# Patient Record
Sex: Male | Born: 2000 | Race: White | Hispanic: No | Marital: Single | Smoking: Never smoker
Health system: Southern US, Community
[De-identification: ages and names within clinical notes are randomized; demographics above are authoritative.]

## PROBLEM LIST (undated history)

## (undated) DIAGNOSIS — J45909 Unspecified asthma, uncomplicated: Secondary | ICD-10-CM

## (undated) HISTORY — PX: NO PAST SURGERIES: SHX2092

---
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
## 2018-01-20 ENCOUNTER — Other Ambulatory Visit: Payer: Self-pay

## 2018-01-20 ENCOUNTER — Emergency Department
Admission: EM | Admit: 2018-01-20 | Discharge: 2018-01-20 | Disposition: A | Discharge status: Home / Self Care | Payer: BLUE CROSS/BLUE SHIELD | Attending: Emergency Medicine | Admitting: Emergency Medicine

## 2018-01-20 ENCOUNTER — Encounter: Payer: Self-pay | Admitting: *Deleted

## 2018-01-20 ENCOUNTER — Emergency Department: Payer: BLUE CROSS/BLUE SHIELD

## 2018-01-20 DIAGNOSIS — Z79899 Other long term (current) drug therapy: Secondary | ICD-10-CM | POA: Insufficient documentation

## 2018-01-20 DIAGNOSIS — J9601 Acute respiratory failure with hypoxia: Secondary | ICD-10-CM

## 2018-01-20 DIAGNOSIS — R0603 Acute respiratory distress: Secondary | ICD-10-CM | POA: Diagnosis present

## 2018-01-20 DIAGNOSIS — J4551 Severe persistent asthma with (acute) exacerbation: Secondary | ICD-10-CM | POA: Insufficient documentation

## 2018-01-20 DIAGNOSIS — J45901 Unspecified asthma with (acute) exacerbation: Secondary | ICD-10-CM

## 2018-01-20 LAB — CBC
HCT: 44.3 % (ref 39.0–52.0)
Hemoglobin: 15.3 g/dL (ref 13.0–17.0)
MCH: 30.3 pg (ref 26.0–34.0)
MCHC: 34.5 g/dL (ref 30.0–36.0)
MCV: 87.7 fL (ref 80.0–100.0)
NRBC: 0 % (ref 0.0–0.2)
PLATELETS: 239 10*3/uL (ref 150–400)
RBC: 5.05 MIL/uL (ref 4.22–5.81)
RDW: 11.8 % (ref 11.5–15.5)
WBC: 9.2 10*3/uL (ref 4.0–10.5)

## 2018-01-20 LAB — COMPREHENSIVE METABOLIC PANEL
ALBUMIN: 5.2 g/dL — AB (ref 3.5–5.0)
ALT: 17 U/L (ref 0–44)
AST: 23 U/L (ref 15–41)
Alkaline Phosphatase: 54 U/L (ref 38–126)
Anion gap: 9 (ref 5–15)
BUN: 10 mg/dL (ref 6–20)
CHLORIDE: 99 mmol/L (ref 98–111)
CO2: 28 mmol/L (ref 22–32)
CREATININE: 0.77 mg/dL (ref 0.61–1.24)
Calcium: 8.9 mg/dL (ref 8.9–10.3)
GFR calc Af Amer: >60 mL/min (ref >60)
GFR calc non Af Amer: >60 mL/min (ref >60)
GLUCOSE: 137 mg/dL — AB (ref 70–99)
Potassium: 3.8 mmol/L (ref 3.5–5.1)
SODIUM: 136 mmol/L (ref 135–145)
Total Bilirubin: 1.1 mg/dL (ref 0.3–1.2)
Total Protein: 7.8 g/dL (ref 6.5–8.1)

## 2018-01-20 MED ORDER — EPINEPHRINE 0.3 MG/0.3ML IJ SOAJ
0.3000 mg | Freq: Once | INTRAMUSCULAR | Status: AC
  Administered 2018-01-20: 0.3 mg via INTRAMUSCULAR

## 2018-01-20 MED ORDER — IPRATROPIUM-ALBUTEROL 0.5-2.5 (3) MG/3ML IN SOLN
3.0000 mL | Freq: Once | RESPIRATORY_TRACT | Status: AC
  Administered 2018-01-20: 3 mL via RESPIRATORY_TRACT

## 2018-01-20 MED ORDER — MAGNESIUM SULFATE 2 GM/50ML IV SOLN
2.0000 g | Freq: Once | INTRAVENOUS | Status: AC
  Administered 2018-01-20: 2 g via INTRAVENOUS

## 2018-01-20 MED ORDER — IPRATROPIUM-ALBUTEROL 0.5-2.5 (3) MG/3ML IN SOLN
RESPIRATORY_TRACT | Status: AC
  Administered 2018-01-20: 3 mL via RESPIRATORY_TRACT
  Filled 2018-01-20: qty 3

## 2018-01-20 MED ORDER — PREDNISONE 50 MG PO TABS: 50.0000 mg | ORAL_TABLET | Freq: Every day | ORAL | 0 refills | Status: DC

## 2018-01-20 NOTE — ED Provider Notes (Signed)
[facility] Emergency Department Provider Note   ____________________________________________    I have reviewed the triage vital signs and the nursing notes.   HISTORY  Chief Complaint Respiratory Distress     HPI Louis Williamson is a 17 y.o. male who presents in respiratory distress.  Patient has a history of asthma.  EMS reports relatively abrupt onset of shortness of breath.  They have given Solu-Medrol, multiple duo nebs.  Patient did not tolerate CPAP.  No further history available at this time due to patient distress   Past Medical History:  Diagnosis Date  . Asthma     There are no active problems to display for this patient.   Past Surgical History:  Procedure Laterality Date  . NO PAST SURGERIES      Prior to Admission medications   Medication Sig Start Date End Date Taking? Authorizing Provider  albuterol (ACCUNEB) 1.25 MG/3ML nebulizer solution Take 3 mLs (1.25 mg total) by nebulization every 4 (four) hours as needed for wheezing or shortness of breath. 12/24/14   Renford Dills, NP  albuterol (PROVENTIL HFA;VENTOLIN HFA) 108 (90 Base) MCG/ACT inhaler Inhale 1-2 puffs into the lungs every 6 (six) hours as needed for wheezing or shortness of breath. 02/21/17   Domenick Gong, MD  fluticasone (FLONASE) 50 MCG/ACT nasal spray Place 2 sprays into both nostrils daily. 02/21/17   Domenick Gong, MD  fluticasone (FLOVENT HFA) 110 MCG/ACT inhaler Inhale into the lungs 2 (two) times daily.    [provider]  ibuprofen (ADVIL,MOTRIN) 600 MG tablet Take 1 tablet (600 mg total) by mouth every 6 (six) hours as needed. 09/20/17   Domenick Gong, MD  Spacer/Aero-Holding Chambers (AEROCHAMBER PLUS) inhaler Use as instructed 02/21/17   Domenick Gong, MD     Allergies Patient has no known allergies.  Family History  Problem Relation Age of Onset  . Healthy Mother   . Healthy Father     Social History Social History     Tobacco Use  . Smoking status: Never Smoker  . Smokeless tobacco: Never Used  Substance Use Topics  . Alcohol use: No  . Drug use: No    Level 5 caveat: Unable to obtain full review of Systems due to severe distress  Constitutional: No fever/chills .  ENT: No throat swelling Cardiovascular: No chest pain Respiratory: As above   ____________________________________________   PHYSICAL EXAM:  VITAL SIGNS: ED Triage Vitals [01/20/18 2016]  Enc Vitals Group     BP 138/86     Pulse Rate (!) 107     Resp      Temp 98.2 F (36.8 C)     Temp Source Axillary     SpO2 100 %     Weight 56.7 kg (125 lb)     Height 1.727 m (5\' 8" )     Head Circumference      Peak Flow      Pain Score 0     Pain Loc      Pain Edu?      Excl. in GC?     Constitutional: Alert and oriented, sitting up in tripod position severe respiratory distress  Head: Atraumatic.  Mouth/Throat: Mucous membranes are moist.   Neck:  Painless ROM Cardiovascular: Tachycardia, regular rhythm. Grossly normal heart sounds.  Good peripheral circulation. Respiratory: Increased respiratory effort with retractions, diffuse wheezing Gastrointestinal: Soft and nontender. No distention.   Musculoskeletal: No lower extremity tenderness nor edema.  Warm and well perfused Neurologic:  No gross focal neurologic deficits are appreciated.  Skin:  Skin is warm, dry and intact. No rash noted.   ____________________________________________   LABS (all labs ordered are listed, but only abnormal results are displayed)  Labs Reviewed  COMPREHENSIVE METABOLIC PANEL - Abnormal; Notable for the following components:      Result Value   Glucose, Bld 137 (*)    Albumin 5.2 (*)    All other components within normal limits  CBC   ____________________________________________  EKG  ED ECG REPORT I, Jene Every, the attending physician, personally viewed and interpreted this ECG.  Date: 01/20/2018  Rhythm: normal  sinus rhythm QRS Axis: normal Intervals: normal ST/T Wave abnormalities: normal Narrative Interpretation: no evidence of acute ischemia  ____________________________________________  RADIOLOGY  Chest x-ray unremarkable ____________________________________________   PROCEDURES  Procedure(s) performed: No  Procedures   Critical Care performed: yes  CRITICAL CARE Performed by: Jene Every   Total critical care time: 30 minutes  Critical care time was exclusive of separately billable procedures and treating other patients.  Critical care was necessary to treat or prevent imminent or life-threatening deterioration.  Critical care was time spent personally by me on the following activities: development of treatment plan with patient and/or surrogate as well as nursing, discussions with consultants, evaluation of patient's response to treatment, examination of patient, obtaining history from patient or surrogate, ordering and performing treatments and interventions, ordering and review of laboratory studies, ordering and review of radiographic studies, pulse oximetry and re-evaluation of patient's condition.  ____________________________________________   INITIAL IMPRESSION / ASSESSMENT AND PLAN / ED COURSE  Pertinent labs & imaging results that were available during my care of the patient were reviewed by me and considered in my medical decision making (see chart for details).  Patient presents in respiratory distress, likely related to severe asthma exacerbation.  Patient given IM epinephrine, 2 g of IV magnesium and started on BiPAP.  DuoNeb's given as well  After treatment and after about 10 minutes on BiPAP patient is markedly improved.  He is able to answer questions now states he is feeling much better.  Mother is here and reports he has had to be hospitalized once or twice before in his life  ----------------------------------------- 10:21 PM on  01/20/2018 -----------------------------------------  Patient is at baseline.  He states he feels much better.  On exam no further wheezing.  Clinically very well-appearing.  He has albuterol at home.  Will give 5 days of prednisone    ____________________________________________   FINAL CLINICAL IMPRESSION(S) / ED DIAGNOSES  Final diagnoses:  Severe asthma with exacerbation, unspecified whether persistent  Acute respiratory failure with hypoxia (HCC)        Note:  This document was prepared using Dragon voice recognition software and may include unintentional dictation errors.    Jene Every, MD 01/20/18 2222

## 2018-01-20 NOTE — ED Notes (Signed)
Bipap off

## 2018-01-20 NOTE — ED Triage Notes (Signed)
Per EMS pt was fine all day and then around 1930 he quickly became short of breath. Pt has know asthma. Had 2 alb nebs, solumedrol and started 2 gms Mag IV en route. Pt states he feels better

## 2018-02-03 ENCOUNTER — Emergency Department: Payer: BLUE CROSS/BLUE SHIELD

## 2018-02-03 ENCOUNTER — Encounter: Payer: Self-pay | Admitting: Emergency Medicine

## 2018-02-03 ENCOUNTER — Other Ambulatory Visit: Payer: Self-pay

## 2018-02-03 ENCOUNTER — Emergency Department
Admission: EM | Admit: 2018-02-03 | Discharge: 2018-02-03 | Disposition: A | Discharge status: Home / Self Care | Payer: BLUE CROSS/BLUE SHIELD | Attending: Emergency Medicine | Admitting: Emergency Medicine

## 2018-02-03 DIAGNOSIS — Z79899 Other long term (current) drug therapy: Secondary | ICD-10-CM | POA: Insufficient documentation

## 2018-02-03 DIAGNOSIS — R0602 Shortness of breath: Secondary | ICD-10-CM | POA: Diagnosis present

## 2018-02-03 DIAGNOSIS — J4541 Moderate persistent asthma with (acute) exacerbation: Secondary | ICD-10-CM | POA: Insufficient documentation

## 2018-02-03 LAB — COMPREHENSIVE METABOLIC PANEL
ALBUMIN: 5.3 g/dL — AB (ref 3.5–5.0)
ALT: 21 U/L (ref 0–44)
AST: 27 U/L (ref 15–41)
Alkaline Phosphatase: 60 U/L (ref 38–126)
Anion gap: 9 (ref 5–15)
BILIRUBIN TOTAL: 1.4 mg/dL — AB (ref 0.3–1.2)
BUN: 11 mg/dL (ref 6–20)
CALCIUM: 9.6 mg/dL (ref 8.9–10.3)
CO2: 29 mmol/L (ref 22–32)
CREATININE: 0.77 mg/dL (ref 0.61–1.24)
Chloride: 102 mmol/L (ref 98–111)
GFR calc Af Amer: >60 mL/min (ref >60)
GFR calc non Af Amer: >60 mL/min (ref >60)
Glucose, Bld: 88 mg/dL (ref 70–99)
Potassium: 4.1 mmol/L (ref 3.5–5.1)
SODIUM: 140 mmol/L (ref 135–145)
TOTAL PROTEIN: 8.1 g/dL (ref 6.5–8.1)

## 2018-02-03 LAB — CBC
HEMATOCRIT: 49.2 % (ref 39.0–52.0)
Hemoglobin: 17.3 g/dL — ABNORMAL HIGH (ref 13.0–17.0)
MCH: 30.7 pg (ref 26.0–34.0)
MCHC: 35.2 g/dL (ref 30.0–36.0)
MCV: 87.4 fL (ref 80.0–100.0)
Platelets: 252 10*3/uL (ref 150–400)
RBC: 5.63 MIL/uL (ref 4.22–5.81)
RDW: 11.8 % (ref 11.5–15.5)
WBC: 10.8 10*3/uL — AB (ref 4.0–10.5)
nRBC: 0 % (ref 0.0–0.2)

## 2018-02-03 MED ORDER — IPRATROPIUM-ALBUTEROL 0.5-2.5 (3) MG/3ML IN SOLN
3.0000 mL | Freq: Once | RESPIRATORY_TRACT | Status: AC
  Administered 2018-02-03: 3 mL via RESPIRATORY_TRACT
  Filled 2018-02-03: qty 3

## 2018-02-03 MED ORDER — ALBUTEROL SULFATE HFA 108 (90 BASE) MCG/ACT IN AERS: 1.0000 | INHALATION_SPRAY | Freq: Four times a day (QID) | RESPIRATORY_TRACT | 0 refills | Status: DC | PRN

## 2018-02-03 MED ORDER — MAGNESIUM SULFATE 2 GM/50ML IV SOLN
2.0000 g | Freq: Once | INTRAVENOUS | Status: AC
  Administered 2018-02-03: 2 g via INTRAVENOUS
  Filled 2018-02-03: qty 50

## 2018-02-03 MED ORDER — PREDNISONE 50 MG PO TABS: 50.0000 mg | ORAL_TABLET | Freq: Every day | ORAL | 0 refills | Status: DC

## 2018-02-03 MED ORDER — METHYLPREDNISOLONE SODIUM SUCC 125 MG IJ SOLR
125.0000 mg | Freq: Once | INTRAMUSCULAR | Status: AC
  Administered 2018-02-03: 125 mg via INTRAVENOUS
  Filled 2018-02-03: qty 2

## 2018-02-03 NOTE — ED Notes (Signed)
Pt arrives in the midst of an asthma attack; sats at stat desk 88-90% on room air; HR 126; pt taken to treatment room 12 via wheelchair

## 2018-02-03 NOTE — ED Triage Notes (Signed)
Pt arrived in the midst of an asthma attack; after taking pt to treatment room and placing him on 2L oxygen via [place], sats up to 97%; pt had audible wheezing upon arrival to ED; pt was seen here 2 weeks ago in a worse situation and didn't want to wait that long; pt says he was just sitting at the table eating dinner when his symptoms began; used his inhaler and nebulizer with no relief; pt unable to speak in complete sentences;

## 2018-02-03 NOTE — ED Provider Notes (Signed)
[facility] Emergency Department Provider Note   ____________________________________________    I have reviewed the triage vital signs and the nursing notes.   HISTORY  Chief Complaint Asthma     HPI Louis Williamson is a 17 y.o. male with a history of asthma presents today with shortness of breath.  Patient reports he was eating dinner when he started developed shortness of breath.  I saw this patient 2 weeks ago during a severe asthma exacerbation, he reports he has been doing well until tonight.  He used 2 albuterol inhalers at home without improvement.  He states it is not as severe today as it was last time he was here.  Denies chest pain.  No recent travel.  No calf pain or swelling.  No fevers or chills.  Past Medical History:  Diagnosis Date  . Asthma     There are no active problems to display for this patient.   Past Surgical History:  Procedure Laterality Date  . NO PAST SURGERIES      Prior to Admission medications   Medication Sig Start Date End Date Taking? Authorizing Provider  albuterol (ACCUNEB) 1.25 MG/3ML nebulizer solution Take 3 mLs (1.25 mg total) by nebulization every 4 (four) hours as needed for wheezing or shortness of breath. 12/24/14  Yes Renford Dills, NP  albuterol (PROVENTIL HFA;VENTOLIN HFA) 108 (90 Base) MCG/ACT inhaler Inhale 1-2 puffs into the lungs every 6 (six) hours as needed for wheezing or shortness of breath. 02/21/17  Yes Domenick Gong, MD  fluticasone (FLONASE) 50 MCG/ACT nasal spray Place 2 sprays into both nostrils daily. 02/21/17  Yes Domenick Gong, MD  fluticasone (FLOVENT HFA) 110 MCG/ACT inhaler Inhale into the lungs 2 (two) times daily.   Yes [provider]  ibuprofen (ADVIL,MOTRIN) 600 MG tablet Take 1 tablet (600 mg total) by mouth every 6 (six) hours as needed. 09/20/17  Yes Domenick Gong, MD  Spacer/Aero-Holding Chambers (AEROCHAMBER PLUS) inhaler Use as instructed  02/21/17  Yes Domenick Gong, MD  predniSONE (DELTASONE) 50 MG tablet Take 1 tablet (50 mg total) by mouth daily with breakfast. 02/03/18   Jene Every, MD     Allergies Patient has no known allergies.  Family History  Problem Relation Age of Onset  . Healthy Mother   . Healthy Father     Social History Social History   Tobacco Use  . Smoking status: Never Smoker  . Smokeless tobacco: Never Used  Substance Use Topics  . Alcohol use: No  . Drug use: No    Review of Systems  Constitutional: No fever/chills Eyes: No visual changes.  ENT: No sore throat. Cardiovascular: Denies chest pain. Respiratory: As above Gastrointestinal: No abdominal pain.  No nausea, no vomiting.   Genitourinary: Negative for dysuria. Musculoskeletal: Negative for back pain. Skin: Negative for rash. Neurological: Negative for headaches or weakness   ____________________________________________   PHYSICAL EXAM:  VITAL SIGNS: ED Triage Vitals  Enc Vitals Group     BP 02/03/18 2010 127/79     Pulse Rate 02/03/18 2010 (!) 107     Resp 02/03/18 2010 19     Temp 02/03/18 2010 97.8 F (36.6 C)     Temp Source 02/03/18 2010 Oral     SpO2 --      Weight 02/03/18 2012 54.4 kg (120 lb)     Height 02/03/18 2012 1.727 m (5\' 8" )     Head Circumference --      Peak Flow --  Pain Score 02/03/18 2012 0     Pain Loc --      Pain Edu? --      Excl. in GC? --     Constitutional: Alert and oriented. No acute distress. Pleasant and interactive  Nose: No congestion/rhinnorhea. Mouth/Throat: Mucous membranes are moist.   Neck:  Painless ROM Cardiovascular: Normal rate, regular rhythm. Grossly normal heart sounds.  Good peripheral circulation. Respiratory: Increased respiratory effort with mild tachypnea no retractions.  Diffuse wheezing Gastrointestinal: Soft and nontender. No distention.  Musculoskeletal: No lower extremity tenderness nor edema.  Warm and well perfused Neurologic:   Normal speech and language. No gross focal neurologic deficits are appreciated.  Skin:  Skin is warm, dry and intact. No rash noted. Psychiatric: Mood and affect are normal. Speech and behavior are normal.  ____________________________________________   LABS (all labs ordered are listed, but only abnormal results are displayed)  Labs Reviewed  CBC - Abnormal; Notable for the following components:      Result Value   WBC 10.8 (*)    Hemoglobin 17.3 (*)    All other components within normal limits  COMPREHENSIVE METABOLIC PANEL - Abnormal; Notable for the following components:   Albumin 5.3 (*)    Total Bilirubin 1.4 (*)    All other components within normal limits   ____________________________________________  EKG  None ____________________________________________  RADIOLOGY  Chest x-ray normal ____________________________________________   PROCEDURES  Procedure(s) performed: No  Procedures   Critical Care performed: yes  CRITICAL CARE Performed by: Jene Everyobert Afsheen Antony   Total critical care time: 30minutes  Critical care time was exclusive of separately billable procedures and treating other patients.  Critical care was necessary to treat or prevent imminent or life-threatening deterioration.  Critical care was time spent personally by me on the following activities: development of treatment plan with patient and/or surrogate as well as nursing, discussions with consultants, evaluation of patient's response to treatment, examination of patient, obtaining history from patient or surrogate, ordering and performing treatments and interventions, ordering and review of laboratory studies, ordering and review of radiographic studies, pulse oximetry and re-evaluation of patient's condition.  ____________________________________________   INITIAL IMPRESSION / ASSESSMENT AND PLAN / ED COURSE  Pertinent labs & imaging results that were available during my care of the  patient were reviewed by me and considered in my medical decision making (see chart for details).  Patient presents with shortness of breath, found to be hypoxic by triage nurse.  Started on oxygen, DuoNeb started.  We will give IV magnesium, IV Solu-Medrol check labs, x-ray and carefully monitor  ----------------------------------------- 10:21 PM on 02/03/2018 -----------------------------------------  Patient is markedly improved after treatment, no further wheezing at this time.  He feels much better.  Vital signs are normal.  Lab work is unremarkable.  Appropriate for outpatient follow-up, he will follow-up with pulmonology    ____________________________________________   FINAL CLINICAL IMPRESSION(S) / ED DIAGNOSES  Final diagnoses:  Moderate persistent asthma with exacerbation        Note:  This document was prepared using Dragon voice recognition software and may include unintentional dictation errors.    Jene EveryKinner, Mavi Un, MD 02/03/18 2221

## 2018-02-03 NOTE — ED Notes (Signed)
Patient states he feels better, respirations are not labored at this time. Patient is currently on room air with a pulse ox of 94%. Patient able to speak in complete sentences without pause. Inspiratory wheezing still present, otherwise good air exchange noted.

## 2018-02-03 NOTE — ED Notes (Signed)
Patient remains on room air with sats at 97%.

## 2019-07-16 IMAGING — CR DG HAND COMPLETE 3+V*R*
3 series · 3 of 3 positions shown · non-contrast
Comparison: None.

CLINICAL DATA: Pain after hitting wall

EXAM:
RIGHT HAND - COMPLETE 3+ VIEW

[hand ap]
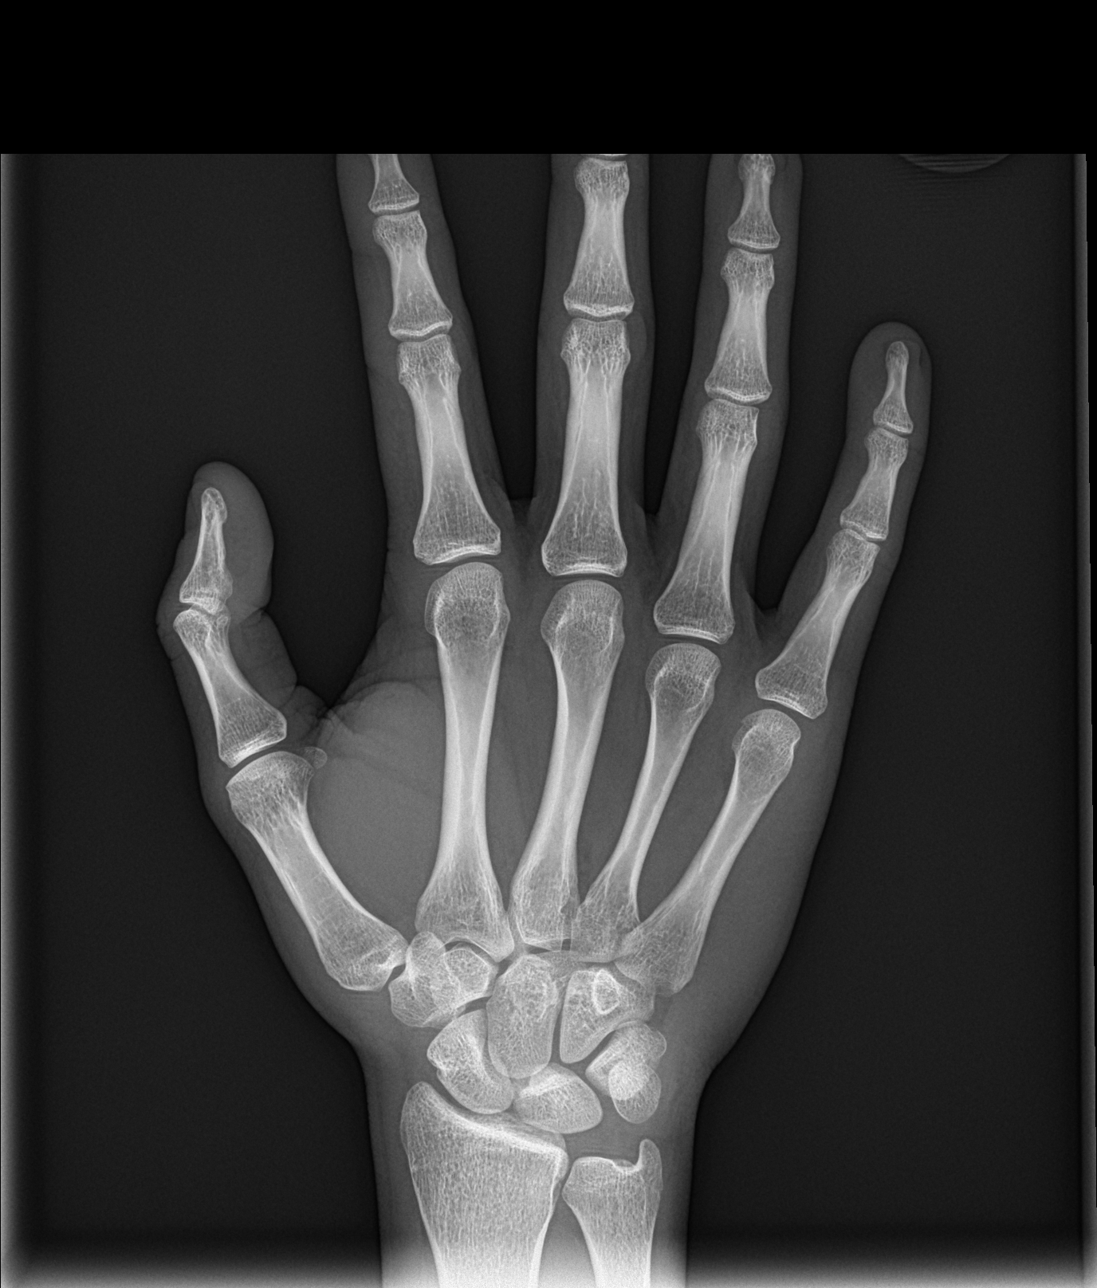

[hand obl]
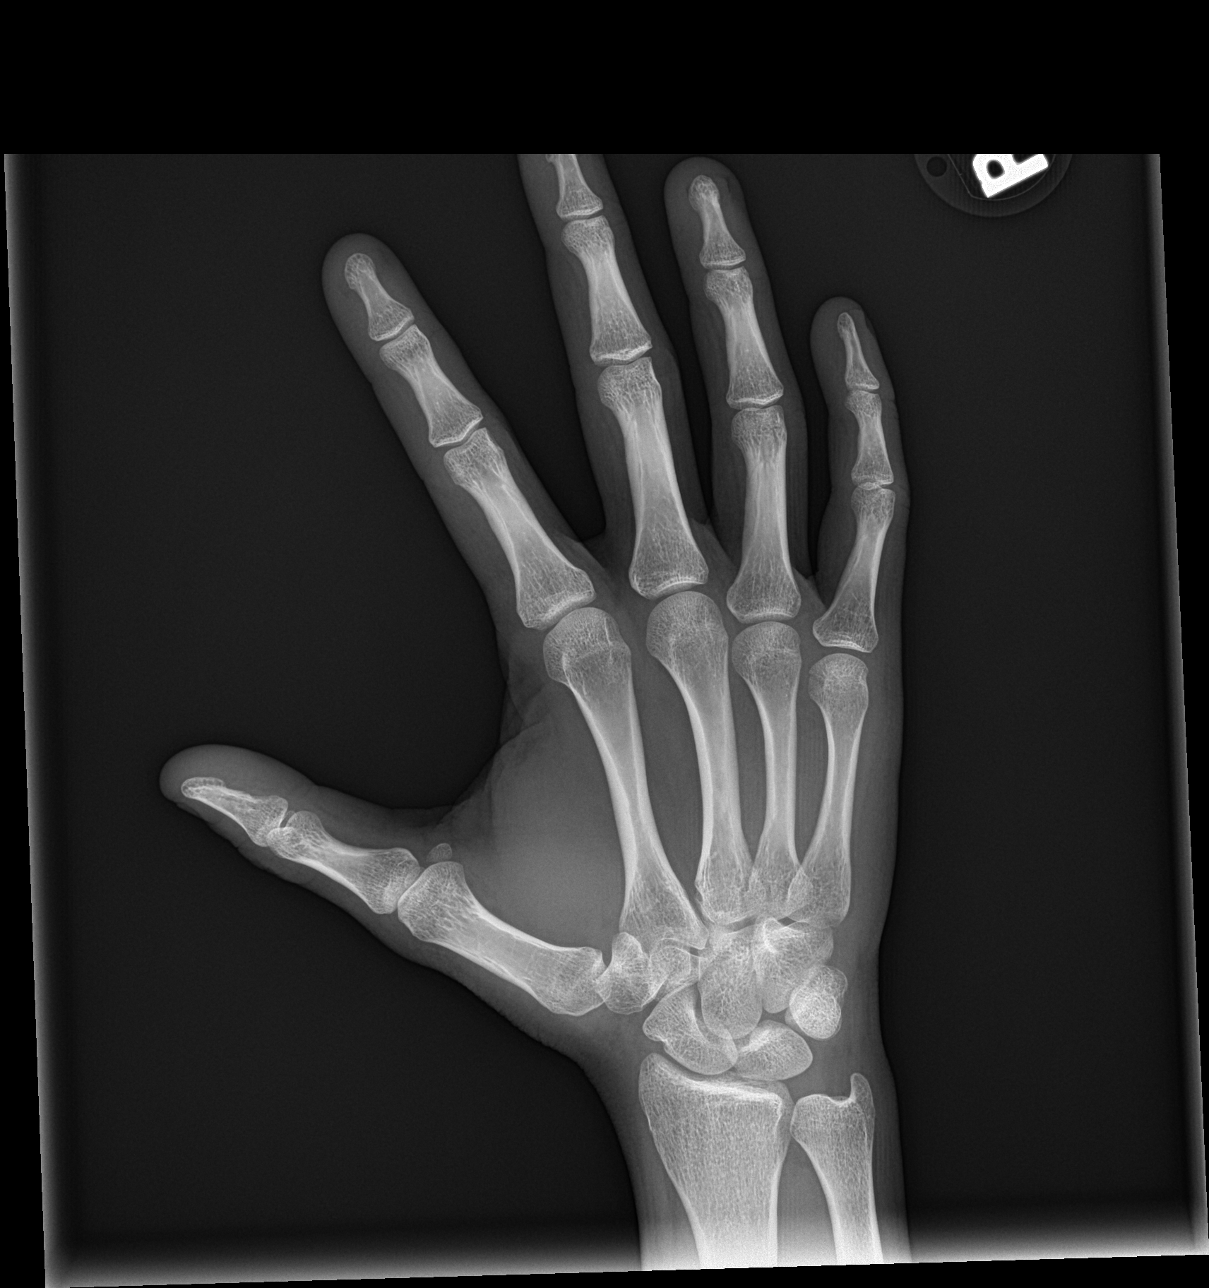

[hand lat]
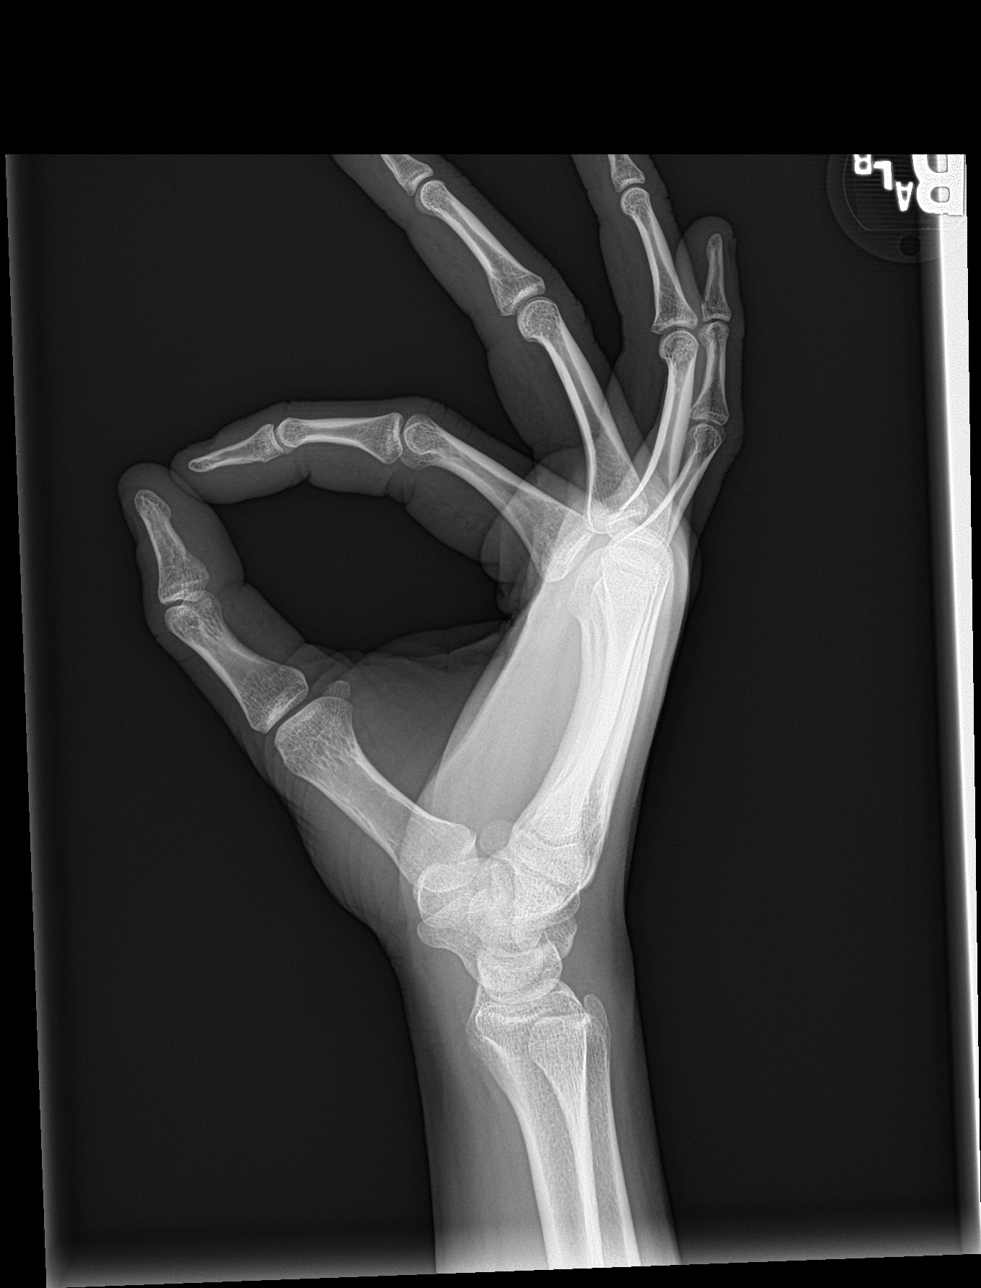

[3 of 3 positions shown; findings below may reference images not displayed]

FINDINGS: Frontal, oblique, and lateral views obtained. There is mild soft
tissue swelling. There is a fracture of the distal aspect of the
fifth metacarpal with alignment essentially anatomic. No other
fracture. No dislocation. No appreciable joint space narrowing or
erosion.
IMPRESSION: Nondisplaced fracture distal aspect fifth metacarpal with soft
tissue swelling in this area. No other fracture. No dislocation. No
evident arthropathy.

These results will be called to the ordering clinician or
representative by the Radiologist Assistant, and communication
documented in the PACS or zVision Dashboard.

## 2020-11-09 ENCOUNTER — Other Ambulatory Visit: Payer: Self-pay

## 2020-11-09 ENCOUNTER — Inpatient Hospital Stay
Admission: EM | Admit: 2020-11-09 | Discharge: 2020-11-11 | DRG: 202 | Disposition: A | Discharge status: Home / Self Care | Payer: BC Managed Care – PPO | Attending: Internal Medicine | Admitting: Internal Medicine

## 2020-11-09 ENCOUNTER — Emergency Department: Payer: BC Managed Care – PPO

## 2020-11-09 DIAGNOSIS — R0902 Hypoxemia: Secondary | ICD-10-CM

## 2020-11-09 DIAGNOSIS — R0603 Acute respiratory distress: Secondary | ICD-10-CM

## 2020-11-09 DIAGNOSIS — I959 Hypotension, unspecified: Secondary | ICD-10-CM | POA: Diagnosis present

## 2020-11-09 DIAGNOSIS — J4551 Severe persistent asthma with (acute) exacerbation: Principal | ICD-10-CM | POA: Diagnosis present

## 2020-11-09 DIAGNOSIS — Z20822 Contact with and (suspected) exposure to covid-19: Secondary | ICD-10-CM | POA: Diagnosis present

## 2020-11-09 DIAGNOSIS — E876 Hypokalemia: Secondary | ICD-10-CM | POA: Diagnosis not present

## 2020-11-09 DIAGNOSIS — E861 Hypovolemia: Secondary | ICD-10-CM | POA: Diagnosis present

## 2020-11-09 DIAGNOSIS — J9601 Acute respiratory failure with hypoxia: Secondary | ICD-10-CM | POA: Diagnosis present

## 2020-11-09 DIAGNOSIS — R000 Tachycardia, unspecified: Secondary | ICD-10-CM | POA: Diagnosis present

## 2020-11-09 DIAGNOSIS — Z7951 Long term (current) use of inhaled steroids: Secondary | ICD-10-CM

## 2020-11-09 DIAGNOSIS — J45901 Unspecified asthma with (acute) exacerbation: Secondary | ICD-10-CM | POA: Diagnosis present

## 2020-11-09 MED ORDER — IPRATROPIUM-ALBUTEROL 0.5-2.5 (3) MG/3ML IN SOLN
3.0000 mL | Freq: Once | RESPIRATORY_TRACT | Status: AC
  Administered 2020-11-10: 3 mL via RESPIRATORY_TRACT
  Filled 2020-11-09: qty 3

## 2020-11-09 MED ORDER — IPRATROPIUM-ALBUTEROL 0.5-2.5 (3) MG/3ML IN SOLN
3.0000 mL | Freq: Once | RESPIRATORY_TRACT | Status: AC
  Administered 2020-11-10: 3 mL via RESPIRATORY_TRACT

## 2020-11-09 NOTE — ED Provider Notes (Signed)
[facility] Emergency Department Provider Note   ____________________________________________   Event Date/Time   First MD Initiated Contact with Patient 11/09/20 2348     (approximate)  I have reviewed the triage vital signs and the nursing notes.   HISTORY  Chief Complaint Shortness of Breath  Level of V caveat: Limited by respiratory distress  HPI Louis Williamson is a 20 y.o. male brought to the ED via EMS from home with a chief complaint of respiratory distress.  Patient has a history of asthma, never BiPAP or intubation, who states he worked outdoors all day today.  Began having an asthma exacerbation this evening.  Room air saturations in the mid 70%'s.  Received 6 albuterol nebulizers, 1 DuoNeb, 125 mg IV Solu-Medrol, 2 g IV magnesium, 0.5 mg IM epinephrine prior to arrival.  Arrives to the ED on CPAP.  Denies fever, cough, abdominal pain, nausea, vomiting or diarrhea.     Past Medical History:  Diagnosis Date   Asthma     Patient Active Problem List   Diagnosis Date Noted   Acute asthma exacerbation 11/10/2020     Past Surgical History:  Procedure Laterality Date   NO PAST SURGERIES      Prior to Admission medications   Medication Sig Start Date End Date Taking? Authorizing Provider  albuterol (PROVENTIL HFA;VENTOLIN HFA) 108 (90 Base) MCG/ACT inhaler Inhale 1-2 puffs into the lungs every 6 (six) hours as needed for wheezing or shortness of breath. 02/03/18  Yes Jene Every, MD  SYMBICORT 160-4.5 MCG/ACT inhaler Inhale 2 puffs into the lungs in the morning and at bedtime. 07/13/20  Yes [provider]  albuterol (ACCUNEB) 1.25 MG/3ML nebulizer solution Take 3 mLs (1.25 mg total) by nebulization every 4 (four) hours as needed for wheezing or shortness of breath. 12/24/14   Renford Dills, NP    Allergies Patient has no known allergies.  Family History  Problem Relation Age of Onset   Healthy Mother    Healthy Father      Social History Social History   Tobacco Use   Smoking status: Never   Smokeless tobacco: Never  Vaping Use   Vaping Use: Never used  Substance Use Topics   Alcohol use: No   Drug use: No    Review of Systems  Constitutional: No fever/chills Eyes: No visual changes. ENT: No sore throat. Cardiovascular: Denies chest pain. Respiratory: Positive for shortness of breath. Gastrointestinal: No abdominal pain.  No nausea, no vomiting.  No diarrhea.  No constipation. Genitourinary: Negative for dysuria. Musculoskeletal: Negative for back pain. Skin: Negative for rash. Neurological: Negative for headaches, focal weakness or numbness.   ____________________________________________   PHYSICAL EXAM:  VITAL SIGNS: ED Triage Vitals  Enc Vitals Group     BP      Pulse      Resp      Temp      Temp src      SpO2      Weight      Height      Head Circumference      Peak Flow      Pain Score      Pain Loc      Pain Edu?      Excl. in GC?     Constitutional: Alert and oriented.  Ill appearing and in moderate acute distress. Eyes: Conjunctivae are normal. PERRL. EOMI. Head: Atraumatic. Nose: No congestion/rhinnorhea. Mouth/Throat: Mucous membranes are moist.   Neck: No stridor.  Cardiovascular: Tachycardic rate, regular rhythm. Grossly normal heart sounds.  Good peripheral circulation. Respiratory: Increased respiratory effort.  Retractions.  Tripoding.  Lungs with diminished aeration bilaterally. Gastrointestinal: Soft and nontender to light or deep palpation. No distention. No abdominal bruits. No CVA tenderness. Musculoskeletal: No lower extremity tenderness nor edema.  No joint effusions. Neurologic:  Normal speech and language. No gross focal neurologic deficits are appreciated.  Skin:  Skin is warm, dry and intact. No rash noted. Psychiatric: Mood and affect are normal. Speech and behavior are normal.  ____________________________________________    LABS (all labs ordered are listed, but only abnormal results are displayed)  Labs Reviewed  CBC WITH DIFFERENTIAL/PLATELET - Abnormal; Notable for the following components:      Result Value   MCHC 37.4 (*)    All other components within normal limits  COMPREHENSIVE METABOLIC PANEL - Abnormal; Notable for the following components:   Potassium 2.7 (*)    CO2 20 (*)    Glucose, Bld 168 (*)    Calcium 8.8 (*)    All other components within normal limits  BLOOD GAS, ARTERIAL - Abnormal; Notable for the following components:   pH, Arterial 7.61 (*)    pCO2 arterial 19 (*)    pO2, Arterial 165 (*)    Bicarbonate 19.1 (*)    All other components within normal limits  RESP PANEL BY RT-PCR (FLU A&B, COVID) ARPGX2  CK  HIV ANTIBODY (ROUTINE TESTING W REFLEX)  MAGNESIUM  TROPONIN I (HIGH SENSITIVITY)  TROPONIN I (HIGH SENSITIVITY)   ____________________________________________  EKG  ED ECG REPORT I, Terissa Haffey J, the attending physician, personally viewed and interpreted this ECG.   Date: 11/09/2020  EKG Time: 2355  Rate: 111  Rhythm: sinus tachycardia  Axis: Normal  Intervals:none  ST&T Change: Nonspecific  ____________________________________________  RADIOLOGY I, Alyza Artiaga J, personally viewed and evaluated these images (plain radiographs) as part of my medical decision making, as well as reviewing the written report by the radiologist.  ED MD interpretation: No acute cardiopulmonary process  Official radiology report(s): DG Chest Port 1 View  Result Date: 11/10/2020 CLINICAL DATA:  Worsening shortness of breath. EXAM: PORTABLE CHEST 1 VIEW COMPARISON:  February 03, 2018 FINDINGS: The heart size and mediastinal contours are within normal limits. Both lungs are clear. The visualized skeletal structures are unremarkable. IMPRESSION: No active cardiopulmonary disease. Electronically Signed   By: Aram Candela M.D.   On: 11/10/2020 00:05     ____________________________________________   PROCEDURES  Procedure(s) performed (including Critical Care):  .1-3 Lead EKG Interpretation  Date/Time: 11/09/2020 11:54 PM Performed by: Irean Hong, MD Authorized by: Irean Hong, MD    CRITICAL CARE Performed by: Irean Hong   Total critical care time: 45 minutes  Critical care time was exclusive of separately billable procedures and treating other patients.  Critical care was necessary to treat or prevent imminent or life-threatening deterioration.  Critical care was time spent personally by me on the following activities: development of treatment plan with patient and/or surrogate as well as nursing, discussions with consultants, evaluation of patient's response to treatment, examination of patient, obtaining history from patient or surrogate, ordering and performing treatments and interventions, ordering and review of laboratory studies, ordering and review of radiographic studies, pulse oximetry and re-evaluation of patient's condition.  ____________________________________________   INITIAL IMPRESSION / ASSESSMENT AND PLAN / ED COURSE  As part of my medical decision making, I reviewed the following data within the electronic MEDICAL RECORD NUMBER Nursing notes  reviewed and incorporated, Labs reviewed, EKG interpreted, Old chart reviewed, Radiograph reviewed, Discussed with admitting physician, and Notes from prior ED visits     20 year old male presenting with asthma exacerbation and hypoxia. Differential includes, but is not limited to, viral syndrome, bronchitis including COPD exacerbation, pneumonia, reactive airway disease including asthma, CHF including exacerbation with or without pulmonary/interstitial edema, pneumothorax, ACS, thoracic trauma, and pulmonary embolism.   Will obtain cardiac panel, chest x-ray.  Switch to BiPAP upon patient's arrival to the treatment room.  Will administer stacked DuoNebs.  Anticipate  hospitalization.  Clinical Course as of 11/10/20 0234  Tue Nov 10, 2020  0019 Breathing improved with low-dose IV Ativan.  Patient looks more comfortable now on BiPAP.  Chest x-ray clear.  Awaiting lab results. [JS]  0119 Hypokalemia noted; will replete IV.  Will discuss with hospitalist services for admission. [JS]  0230 Able to wean patient off BiPAP and patient is currently sleeping on nasal cannula. [JS]    Clinical Course User Index [JS] Irean Hong, MD     ____________________________________________   FINAL CLINICAL IMPRESSION(S) / ED DIAGNOSES  Final diagnoses:  Severe persistent asthma with exacerbation  Hypoxia  Respiratory distress  Hypokalemia     ED Discharge Orders     None        Note:  This document was prepared using Dragon voice recognition software and may include unintentional dictation errors.    Irean Hong, MD 11/10/20 [phone]

## 2020-11-09 NOTE — ED Triage Notes (Signed)
Pt BIBA for resp distress. Pt with hx of asthma, working in yard today when he became increasingly SOB. Pt used his albuterol resue inhaler "tons of times" with no relief. Upon EMS arrival pt with increased WOB at 79% on RA. Pt given 6 albuterol SVN, 1Duoneb, 125 solumedrol, 2g mag, 0.5mg  epi IM.    En route ETCO2 60's  Dexi 130

## 2020-11-10 ENCOUNTER — Encounter: Payer: Self-pay | Admitting: Internal Medicine

## 2020-11-10 DIAGNOSIS — R000 Tachycardia, unspecified: Secondary | ICD-10-CM | POA: Diagnosis present

## 2020-11-10 DIAGNOSIS — I9589 Other hypotension: Secondary | ICD-10-CM | POA: Diagnosis not present

## 2020-11-10 DIAGNOSIS — E876 Hypokalemia: Secondary | ICD-10-CM

## 2020-11-10 DIAGNOSIS — Z20822 Contact with and (suspected) exposure to covid-19: Secondary | ICD-10-CM | POA: Diagnosis present

## 2020-11-10 DIAGNOSIS — E861 Hypovolemia: Secondary | ICD-10-CM

## 2020-11-10 DIAGNOSIS — J9601 Acute respiratory failure with hypoxia: Secondary | ICD-10-CM

## 2020-11-10 DIAGNOSIS — Z7951 Long term (current) use of inhaled steroids: Secondary | ICD-10-CM | POA: Diagnosis not present

## 2020-11-10 DIAGNOSIS — J4551 Severe persistent asthma with (acute) exacerbation: Secondary | ICD-10-CM | POA: Diagnosis present

## 2020-11-10 DIAGNOSIS — I959 Hypotension, unspecified: Secondary | ICD-10-CM | POA: Diagnosis present

## 2020-11-10 DIAGNOSIS — J45901 Unspecified asthma with (acute) exacerbation: Secondary | ICD-10-CM | POA: Diagnosis present

## 2020-11-10 DIAGNOSIS — J4521 Mild intermittent asthma with (acute) exacerbation: Secondary | ICD-10-CM | POA: Diagnosis not present

## 2020-11-10 LAB — COMPREHENSIVE METABOLIC PANEL
ALT: 14 U/L (ref 0–44)
AST: 23 U/L (ref 15–41)
Albumin: 4.6 g/dL (ref 3.5–5.0)
Alkaline Phosphatase: 58 U/L (ref 38–126)
Anion gap: 14 (ref 5–15)
BUN: 14 mg/dL (ref 6–20)
CO2: 20 mmol/L — ABNORMAL LOW (ref 22–32)
Calcium: 8.8 mg/dL — ABNORMAL LOW (ref 8.9–10.3)
Chloride: 104 mmol/L (ref 98–111)
Creatinine, Ser: 0.94 mg/dL (ref 0.61–1.24)
GFR, Estimated: >60 mL/min (ref >60)
Glucose, Bld: 168 mg/dL — ABNORMAL HIGH (ref 70–99)
Potassium: 2.7 mmol/L — CL (ref 3.5–5.1)
Sodium: 138 mmol/L (ref 135–145)
Total Bilirubin: 0.9 mg/dL (ref 0.3–1.2)
Total Protein: 7.4 g/dL (ref 6.5–8.1)

## 2020-11-10 LAB — CBC WITH DIFFERENTIAL/PLATELET
Abs Immature Granulocytes: 0.02 10*3/uL (ref 0.00–0.07)
Basophils Absolute: 0.1 10*3/uL (ref 0.0–0.1)
Basophils Relative: 1 %
Eosinophils Absolute: 0.3 10*3/uL (ref 0.0–0.5)
Eosinophils Relative: 4 %
HCT: 41.4 % (ref 39.0–52.0)
Hemoglobin: 15.5 g/dL (ref 13.0–17.0)
Immature Granulocytes: 0 %
Lymphocytes Relative: 34 %
Lymphs Abs: 3.1 10*3/uL (ref 0.7–4.0)
MCH: 32.3 pg (ref 26.0–34.0)
MCHC: 37.4 g/dL — ABNORMAL HIGH (ref 30.0–36.0)
MCV: 86.3 fL (ref 80.0–100.0)
Monocytes Absolute: 0.5 10*3/uL (ref 0.1–1.0)
Monocytes Relative: 6 %
Neutro Abs: 5.1 10*3/uL (ref 1.7–7.7)
Neutrophils Relative %: 55 %
Platelets: 258 10*3/uL (ref 150–400)
RBC: 4.8 MIL/uL (ref 4.22–5.81)
RDW: 11.8 % (ref 11.5–15.5)
WBC: 9.1 10*3/uL (ref 4.0–10.5)
nRBC: 0 % (ref 0.0–0.2)

## 2020-11-10 LAB — CK: Total CK: 81 U/L (ref 49–397)

## 2020-11-10 LAB — BLOOD GAS, ARTERIAL
Acid-Base Excess: 0.5 mmol/L (ref 0.0–2.0)
Bicarbonate: 19.1 mmol/L — ABNORMAL LOW (ref 20.0–28.0)
Delivery systems: POSITIVE
Expiratory PAP: 5
FIO2: 0.28
Inspiratory PAP: 10
O2 Saturation: 99.7 %
Patient temperature: 37
pCO2 arterial: 19 mmHg — CL (ref 32.0–48.0)
pH, Arterial: 7.61 (ref 7.350–7.450)
pO2, Arterial: 165 mmHg — ABNORMAL HIGH (ref 83.0–108.0)

## 2020-11-10 LAB — POTASSIUM: Potassium: 4.4 mmol/L (ref 3.5–5.1)

## 2020-11-10 LAB — RESP PANEL BY RT-PCR (FLU A&B, COVID) ARPGX2
Influenza A by PCR: NEGATIVE
Influenza B by PCR: NEGATIVE
SARS Coronavirus 2 by RT PCR: NEGATIVE

## 2020-11-10 LAB — TROPONIN I (HIGH SENSITIVITY)
Troponin I (High Sensitivity): 7 ng/L (ref <18)
Troponin I (High Sensitivity): 9 ng/L (ref <18)

## 2020-11-10 LAB — MAGNESIUM: Magnesium: 3.4 mg/dL — ABNORMAL HIGH (ref 1.7–2.4)

## 2020-11-10 MED ORDER — ENOXAPARIN SODIUM 40 MG/0.4ML IJ SOSY
40.0000 mg | PREFILLED_SYRINGE | INTRAMUSCULAR | Status: DC
  Administered 2020-11-10: 40 mg via SUBCUTANEOUS
  Filled 2020-11-10: qty 0.4

## 2020-11-10 MED ORDER — POTASSIUM CHLORIDE 10 MEQ/100ML IV SOLN
10.0000 meq | Freq: Once | INTRAVENOUS | Status: AC
  Administered 2020-11-10: 10 meq via INTRAVENOUS

## 2020-11-10 MED ORDER — POTASSIUM CHLORIDE 10 MEQ/100ML IV SOLN
INTRAVENOUS | Status: AC
  Administered 2020-11-10: 10 meq via INTRAVENOUS
  Filled 2020-11-10: qty 100

## 2020-11-10 MED ORDER — METHYLPREDNISOLONE SODIUM SUCC 125 MG IJ SOLR
120.0000 mg | INTRAMUSCULAR | Status: AC
Start: 2020-11-10 — End: 2020-11-10
  Administered 2020-11-10: 120 mg via INTRAVENOUS
  Filled 2020-11-10: qty 2

## 2020-11-10 MED ORDER — POTASSIUM CHLORIDE 10 MEQ/100ML IV SOLN
10.0000 meq | Freq: Once | INTRAVENOUS | Status: AC
  Administered 2020-11-10: 10 meq via INTRAVENOUS
  Filled 2020-11-10: qty 100

## 2020-11-10 MED ORDER — POTASSIUM CHLORIDE 10 MEQ/100ML IV SOLN
10.0000 meq | Freq: Once | INTRAVENOUS | Status: AC
  Filled 2020-11-10: qty 100

## 2020-11-10 MED ORDER — MIDODRINE HCL 5 MG PO TABS
10.0000 mg | ORAL_TABLET | Freq: Three times a day (TID) | ORAL | Status: DC
  Administered 2020-11-10 (×2): 10 mg via ORAL
  Filled 2020-11-10 (×4): qty 2

## 2020-11-10 MED ORDER — IPRATROPIUM-ALBUTEROL 0.5-2.5 (3) MG/3ML IN SOLN
RESPIRATORY_TRACT | Status: AC
  Administered 2020-11-10: 3 mL via RESPIRATORY_TRACT
  Filled 2020-11-10: qty 3

## 2020-11-10 MED ORDER — SODIUM CHLORIDE 0.9 % IV BOLUS
500.0000 mL | Freq: Once | INTRAVENOUS | Status: AC
  Administered 2020-11-10: 500 mL via INTRAVENOUS

## 2020-11-10 MED ORDER — NICOTINE 21 MG/24HR TD PT24
21.0000 mg | MEDICATED_PATCH | Freq: Once | TRANSDERMAL | Status: AC
  Administered 2020-11-10: 21 mg via TRANSDERMAL
  Filled 2020-11-10: qty 1

## 2020-11-10 MED ORDER — PREDNISONE 20 MG PO TABS: 40.0000 mg | ORAL_TABLET | Freq: Every day | ORAL | Status: DC

## 2020-11-10 MED ORDER — SODIUM CHLORIDE 0.9 % IV BOLUS
1000.0000 mL | Freq: Once | INTRAVENOUS | Status: AC
  Administered 2020-11-10: 1000 mL via INTRAVENOUS

## 2020-11-10 MED ORDER — IPRATROPIUM-ALBUTEROL 0.5-2.5 (3) MG/3ML IN SOLN
3.0000 mL | Freq: Four times a day (QID) | RESPIRATORY_TRACT | Status: DC
  Administered 2020-11-10 – 2020-11-11 (×4): 3 mL via RESPIRATORY_TRACT
  Filled 2020-11-10 (×4): qty 3

## 2020-11-10 MED ORDER — LORAZEPAM 2 MG/ML IJ SOLN
0.5000 mg | Freq: Once | INTRAMUSCULAR | Status: AC
  Administered 2020-11-10: 0.5 mg via INTRAVENOUS
  Filled 2020-11-10: qty 1

## 2020-11-10 NOTE — ED Notes (Signed)
Pt NAD fowlers in bed, breathing even and unlabored. A/ox4, speaking in full and complete sentences, pt states he laid down last night to go to bed and had an asthma exacerbation. Pt states this has happened before many times and this has been standard TX. LS clear. VSS

## 2020-11-10 NOTE — ED Notes (Signed)
MD notfied of K of 2.7

## 2020-11-10 NOTE — ED Notes (Signed)
MD at bedside. 

## 2020-11-10 NOTE — H&P (Addendum)
[place]   PATIENT NAME: Louis Williamson    MR#:  [id number]  DATE OF BIRTH:  April 21, 2000  DATE OF ADMISSION:  11/09/2020  PRIMARY CARE PHYSICIAN: [facility]   Patient is coming from: Home  REQUESTING/REFERRING PHYSICIAN: Dionne Bucy, MD  CHIEF COMPLAINT:   Chief Complaint  Patient presents with  . Shortness of Breath    HISTORY OF PRESENT ILLNESS:  Louis Williamson is a 20 y.o. Caucasian male with medical history significant for asthma, who presented to the emergency room with acute onset of worsening dry cough and wheezing with associated dyspnea for the last couple of days.  He denied any fever or chills.  No chest pain or palpitations.  No nausea or vomiting or abdominal pain.  Did not any rhinorrhea or nasal congestion or sore throat or earache.  No dysuria, oliguria or hematuria or flank pain.  ED Course: Upon position to the emergency room heart rate was 109 and pulse symmetry is 100% on CPAP and later on he was placed on BiPAP then tapered off.  Blood pressure later on dropped to 85/31 and responded to IV fluids coming up to 124/56.  EKG as reviewed by me :EKG showed sinus tachycardia with rate 111 with T wave inversion anteroseptally   Labs revealed an ABG with pH 7.61, HCO3 of 19.1 PCO2 of 19 and PO2 of 165 on BiPAP.  CBC showed no significant normalities.  CMP revealed hypokalemia of 2.7 and blood glucose was 168.  CK was 81 and high-sensitivity troponin I was 7 and later 9.  Influenza antigens and COVID-19 PCR came back negative.    Imaging: Portable chest ray showed no acute Pulmonary disease  The patient was given 6 nebulized Abrol and 1 DuoNeb as well as IV magnesium sulfate and IV Solu-Medrol by EMS.  In the ER he received 2 more duo nebs as well as 40 mill equivalent of IV potassium chloride and later was ordered 1 L bolus of IV normal saline.  He will be admitted to a progressive unit bed for further evaluation and management. PAST  MEDICAL HISTORY:   Past Medical History:  Diagnosis Date  . Asthma     PAST SURGICAL HISTORY:   Past Surgical History:  Procedure Laterality Date  . NO PAST SURGERIES      SOCIAL HISTORY:   Social History   Tobacco Use  . Smoking status: Never  . Smokeless tobacco: Never  Substance Use Topics  . Alcohol use: No    FAMILY HISTORY:   Family History  Problem Relation Age of Onset  . Healthy Mother   . Healthy Father     DRUG ALLERGIES:  No Known Allergies  REVIEW OF SYSTEMS:   ROS As per history of present illness. All pertinent systems were reviewed above. Constitutional, HEENT, cardiovascular, respiratory, GI, GU, musculoskeletal, neuro, psychiatric, endocrine, integumentary and hematologic systems were reviewed and are otherwise negative/unremarkable except for positive findings mentioned above in the HPI.   MEDICATIONS AT HOME:   Prior to Admission medications   Medication Sig Start Date End Date Taking? Authorizing Provider  albuterol (PROVENTIL HFA;VENTOLIN HFA) 108 (90 Base) MCG/ACT inhaler Inhale 1-2 puffs into the lungs every 6 (six) hours as needed for wheezing or shortness of breath. 02/03/18  Yes Jene Every, MD  SYMBICORT 160-4.5 MCG/ACT inhaler Inhale 2 puffs into the lungs in the morning and at bedtime. 07/13/20  Yes [provider]  albuterol (ACCUNEB) 1.25 MG/3ML nebulizer solution  Take 3 mLs (1.25 mg total) by nebulization every 4 (four) hours as needed for wheezing or shortness of breath. 12/24/14   Renford Dills, NP      VITAL SIGNS:  Blood pressure (!) 90/34, pulse (!) 105, resp. rate 20, height 5\' 7"  (1.702 m), weight 61.2 kg, SpO2 94 %.  PHYSICAL EXAMINATION:  Physical Exam  GENERAL:  20 y.o.-year-old patient lying in the bed with mild respiratory distress with conversational dyspnea.   EYES: Pupils equal, round, reactive to light and accommodation. No scleral icterus. Extraocular muscles intact.  HEENT: Head atraumatic,  normocephalic. Oropharynx and nasopharynx clear.  NECK:  Supple, no jugular venous distention. No thyroid enlargement, no tenderness.  LUNGS: Diffuse expiratory wheezes with slightly diminished expiratory airflow with harsh vesicular breathing. CARDIOVASCULAR: Regular rate and rhythm, S1, S2 normal. No murmurs, rubs, or gallops.  ABDOMEN: Soft, nondistended, nontender. Bowel sounds present. No organomegaly or mass.  EXTREMITIES: No pedal edema, cyanosis, or clubbing.  NEUROLOGIC: Cranial nerves II through XII are intact. Muscle strength 5/5 in all extremities. Sensation intact. Gait not checked.  PSYCHIATRIC: The patient is alert and oriented x 3.  Normal affect and good eye contact. SKIN: No obvious rash, lesion, or ulcer.   LABORATORY PANEL:   CBC Recent Labs  Lab 11/10/20 0015  WBC 9.1  HGB 15.5  HCT 41.4  PLT 258   ------------------------------------------------------------------------------------------------------------------  Chemistries  Recent Labs  Lab 11/10/20 0015  NA 138  K 2.7*  CL 104  CO2 20*  GLUCOSE 168*  BUN 14  CREATININE 0.94  CALCIUM 8.8*  AST 23  ALT 14  ALKPHOS 58  BILITOT 0.9   ------------------------------------------------------------------------------------------------------------------  Cardiac Enzymes No results for input(s): TROPONINI in the last 168 hours. ------------------------------------------------------------------------------------------------------------------  RADIOLOGY:  DG Chest Port 1 View  Result Date: 11/10/2020 CLINICAL DATA:  Worsening shortness of breath. EXAM: PORTABLE CHEST 1 VIEW COMPARISON:  February 03, 2018 FINDINGS: The heart size and mediastinal contours are within normal limits. Both lungs are clear. The visualized skeletal structures are unremarkable. IMPRESSION: No active cardiopulmonary disease. Electronically Signed   By: February 05, 2018 M.D.   On: 11/10/2020 00:05      IMPRESSION AND PLAN:  Active  Problems:   Acute asthma exacerbation  1.  Acute severe asthma exacerbation with subsequent acute respiratory failure requiring BiPAP. - The patient will be admitted to a progressive unit bed. - We willWe will continue nebulized bronchodilator therapy with DuoNebs 4 times daily and every 4 hours as needed. - We will add mucolytic therapy. - We will continue steroid therapy with IV Solu-Medrol. - O2 protocol will be followed. - We will hold off his Symbicort and continue Flovent.  2.  Hypokalemia. - We will replace potassium.  The patient has already received 2 g of IV magnesium sulfate.  3.  Hypotension. - This like secondary to hypovolemia. - The patient be hydrated with IV normal saline and will monitor his BP carefully. - We will utilize p.o. midodrine.  DVT prophylaxis: Lovenox. Code Status: full code.  Family Communication:  The plan of care was discussed in details with the patient (and family). I answered all questions. The patient agreed to proceed with the above mentioned plan. Further management will depend upon hospital course. Disposition Plan: Back to previous home environment Consults called: none. All the records are reviewed and case discussed with ED provider.  Status is: Inpatient  Remains inpatient appropriate because:Ongoing diagnostic testing needed not appropriate for outpatient work up, Unsafe d/c plan,  IV treatments appropriate due to intensity of illness or inability to take PO, and Inpatient level of care appropriate due to severity of illness  Dispo: The patient is from: Home              Anticipated d/c is to: Home              Patient currently is not medically stable to d/c.   Difficult to place patient No  TOTAL TIME TAKING CARE OF THIS PATIENT: 55 minutes.    Hannah Beat M.D on 11/10/2020 at 2:20 AM  [facility]   From 7 PM-7 AM, contact night-coverage [email or web address]  CC: Primary care physician; [facility]

## 2020-11-10 NOTE — ED Notes (Signed)
Pt ambulated independently to restroom & back to pt room without difficulty. Pt placed back on cardiac, BP, & pulse ox monitors when he returned to pt room.  Bed low & locked; call light within reach; family at [facility]. No additional needs verbalized at this time.

## 2020-11-10 NOTE — ED Notes (Signed)
MD notified of hypotension, verbal order for 1L NS via secure chat.

## 2020-11-10 NOTE — ED Notes (Signed)
Provider at bedside

## 2020-11-10 NOTE — ED Notes (Signed)
Removed from Bipap by RT

## 2020-11-10 NOTE — Progress Notes (Signed)
PROGRESS NOTE    Louis Williamson  HKV:[id number] DOB: 2001/01/14 DOA: 11/09/2020 PCP: [facility]   Chief complaint.  Shortness of breath. Brief Narrative:   Louis Williamson is a 20 y.o. Caucasian male with medical history significant for asthma, who presented to the emergency room with acute onset of worsening dry cough and wheezing with associated dyspnea for the last couple of days.   Upon arriving the hospital, patient had a severe dyspnea, was a placed on BiPAP, also giving multiple doses nebulization treatment.  Started the Solu-Medrol.  He also had a hypotension after arriving, received IV fluid bolus. Condition is improved, he is off oxygen.  Blood pressure has been stable.  Patient will be kept in hospital for 1 day, potentially discharge home tomorrow.   Assessment & Plan:   Active Problems:   Acute asthma exacerbation   Acute hypoxemic respiratory failure (HCC)  #1.  Acute respiratory failure with hypoxemia. Acute exacerbation asthma. Patient condition has improved.  Continue oral steroids and a DuoNeb.  Patient overnight, most likely discharge home tomorrow  2.  Hypokalemia. Potassium had normalized after giving IV potassium.  Recheck a BMP and magnesium tomorrow.    DVT prophylaxis: Lovenox Code Status: full Family Communication: Father at bedside Disposition Plan:    Status is: Inpatient  Remains inpatient appropriate because:Inpatient level of care appropriate due to severity of illness  Dispo: The patient is from: Home              Anticipated d/c is to: Home              Patient currently is not medically stable to d/c.   Difficult to place patient No        I/O last 3 completed shifts: In: 2900 [IV Piggyback:2900] Out: -  No intake/output data recorded.     Consultants:  None  Procedures: None  Antimicrobials: None  Subjective: Patient condition much improved, currently he is off oxygen, good saturation.  No short of  breath wheezing today. No fever or chills.  Objective: Vitals:   11/10/20 0655 11/10/20 0700 11/10/20 0730 11/10/20 1130  BP: 102/64 103/63 105/65 122/73  Pulse: 68 66 65 65  Resp: 19 19 20 17   Temp:   98.8 F (37.1 C)   TempSrc:   Axillary   SpO2: 96% 96% 96% 95%  Weight:      Height:        Intake/Output Summary (Last 24 hours) at 11/10/2020 1303 Last data filed at 11/10/2020 0655 Gross per 24 hour  Intake 2900 ml  Output --  Net 2900 ml   Filed Weights   11/10/20 0025  Weight: 61.2 kg    Examination:  General exam: Appears calm and comfortable  Respiratory system: Clear to auscultation. Respiratory effort normal. Cardiovascular system: S1 & S2 heard, RRR. No JVD, murmurs, rubs, gallops or clicks. No pedal edema. Gastrointestinal system: Abdomen is nondistended, soft and nontender. No organomegaly or masses felt. Normal bowel sounds heard. Central nervous system: Alert and oriented. No focal neurological deficits. Extremities: Symmetric 5 x 5 power. Skin: No rashes, lesions or ulcers Psychiatry: Judgement and insight appear normal. Mood & affect appropriate.     Data Reviewed: I have personally reviewed following labs and imaging studies  CBC: Recent Labs  Lab 11/10/20 0015  WBC 9.1  NEUTROABS 5.1  HGB 15.5  HCT 41.4  MCV 86.3  PLT 258   Basic Metabolic Panel: Recent Labs  Lab 11/10/20 0013 11/10/20  0015 11/10/20 0832  NA  --  138  --   K  --  2.7* 4.4  CL  --  104  --   CO2  --  20*  --   GLUCOSE  --  168*  --   BUN  --  14  --   CREATININE  --  0.94  --   CALCIUM  --  8.8*  --   MG 3.4*  --   --    GFR: Estimated Creatinine Clearance: 106.7 mL/min (by C-G formula based on SCr of 0.94 mg/dL). Liver Function Tests: Recent Labs  Lab 11/10/20 0015  AST 23  ALT 14  ALKPHOS 58  BILITOT 0.9  PROT 7.4  ALBUMIN 4.6   No results for input(s): LIPASE, AMYLASE in the last 168 hours. No results for input(s): AMMONIA in the last 168  hours. Coagulation Profile: No results for input(s): INR, PROTIME in the last 168 hours. Cardiac Enzymes: Recent Labs  Lab 11/10/20 0015  CKTOTAL 81   BNP (last 3 results) No results for input(s): PROBNP in the last 8760 hours. HbA1C: No results for input(s): HGBA1C in the last 72 hours. CBG: No results for input(s): GLUCAP in the last 168 hours. Lipid Profile: No results for input(s): CHOL, HDL, LDLCALC, TRIG, CHOLHDL, LDLDIRECT in the last 72 hours. Thyroid Function Tests: No results for input(s): TSH, T4TOTAL, FREET4, T3FREE, THYROIDAB in the last 72 hours. Anemia Panel: No results for input(s): VITAMINB12, FOLATE, FERRITIN, TIBC, IRON, RETICCTPCT in the last 72 hours. Sepsis Labs: No results for input(s): PROCALCITON, LATICACIDVEN in the last 168 hours.  Recent Results (from the past 240 hour(s))  Resp Panel by RT-PCR (Flu A&B, Covid) Nasopharyngeal Swab     Status: None   Collection Time: 11/10/20 12:15 AM   Specimen: Nasopharyngeal Swab; Nasopharyngeal(NP) swabs in vial transport medium  Result Value Ref Range Status   SARS Coronavirus 2 by RT PCR NEGATIVE NEGATIVE Final    Comment: (NOTE) SARS-CoV-2 target nucleic acids are NOT DETECTED.  The SARS-CoV-2 RNA is generally detectable in upper respiratory specimens during the acute phase of infection. The lowest concentration of SARS-CoV-2 viral copies this assay can detect is 138 copies/mL. A negative result does not preclude SARS-Cov-2 infection and should not be used as the sole basis for treatment or other patient management decisions. A negative result may occur with  improper specimen collection/handling, submission of specimen other than nasopharyngeal swab, presence of viral mutation(s) within the areas targeted by this assay, and inadequate number of viral copies(<138 copies/mL). A negative result must be combined with clinical observations, patient history, and epidemiological information. The expected result  is Negative.  Fact Sheet for Patients:  BloggerCourse.com  Fact Sheet for Healthcare Providers:  SeriousBroker.it  This test is no t yet approved or cleared by the [place] FDA and  has been authorized for detection and/or diagnosis of SARS-CoV-2 by FDA under an Emergency Use Authorization (EUA). This EUA will remain  in effect (meaning this test can be used) for the duration of the COVID-19 declaration under Section 564(b)(1) of the Act, 21 U.S.C.section 360bbb-3(b)(1), unless the authorization is terminated  or revoked sooner.       Influenza A by PCR NEGATIVE NEGATIVE Final   Influenza B by PCR NEGATIVE NEGATIVE Final    Comment: (NOTE) The Xpert Xpress SARS-CoV-2/FLU/RSV plus assay is intended as an aid in the diagnosis of influenza from Nasopharyngeal swab specimens and should not be used as a sole basis  for treatment. Nasal washings and aspirates are unacceptable for Xpert Xpress SARS-CoV-2/FLU/RSV testing.  Fact Sheet for Patients: BloggerCourse.com  Fact Sheet for Healthcare Providers: SeriousBroker.it  This test is not yet approved or cleared by the [place] FDA and has been authorized for detection and/or diagnosis of SARS-CoV-2 by FDA under an Emergency Use Authorization (EUA). This EUA will remain in effect (meaning this test can be used) for the duration of the COVID-19 declaration under Section 564(b)(1) of the Act, 21 U.S.C. section 360bbb-3(b)(1), unless the authorization is terminated or revoked.  Performed at [facility], [address] [place], [place] [address]          Radiology Studies: [facility] Chest [place] 1 View  Result Date: 11/10/2020 CLINICAL DATA:  Worsening shortness of breath. EXAM: PORTABLE CHEST 1 VIEW COMPARISON:  February 03, 2018 FINDINGS: The heart size and mediastinal contours are within normal limits. Both lungs are  clear. The visualized skeletal structures are unremarkable. IMPRESSION: No active cardiopulmonary disease. Electronically Signed   By: Aram Candela M.D.   On: 11/10/2020 00:05        Scheduled Meds:  enoxaparin (LOVENOX) injection  40 mg Subcutaneous Q24H   ipratropium-albuterol  3 mL Nebulization QID   midodrine  10 mg Oral TID WC   [START ON 11/11/2020] predniSONE  40 mg Oral Q breakfast   Continuous Infusions:   LOS: 0 days    Time spent: No charge    Marrion Coy, MD [facility]   To contact the attending provider between 7A-7P or the covering provider during after hours 7P-7A, please log into the web site [email or web address] and access using universal [place] password for that web site. If you do not have the password, please call the hospital operator.  11/10/2020, 1:03 PM

## 2020-11-10 NOTE — ED Notes (Addendum)
MD notified of persistent hypotension via secure chat  Manual BP taken - 72/38  Verbal order for another 1L bolus given

## 2020-11-10 NOTE — ED Notes (Signed)
MD notified of persistent hypotension after another bolus completion. Awaiting orders.

## 2020-11-10 NOTE — ED Notes (Signed)
Report given to oncoming RN. All questions answered.

## 2020-11-10 NOTE — ED Notes (Signed)
Michelle RN aware of assigned bed 

## 2020-11-10 NOTE — ED Notes (Signed)
Pt resting comfortably in bed, NAD; father at [facility]. No needs verbalized at this time. Lights dimmed for pt comfort. Bed low & locked.

## 2020-11-11 DIAGNOSIS — J4521 Mild intermittent asthma with (acute) exacerbation: Secondary | ICD-10-CM | POA: Diagnosis not present

## 2020-11-11 DIAGNOSIS — J9601 Acute respiratory failure with hypoxia: Secondary | ICD-10-CM | POA: Diagnosis not present

## 2020-11-11 LAB — BASIC METABOLIC PANEL
Anion gap: 7 (ref 5–15)
BUN: 14 mg/dL (ref 6–20)
CO2: 29 mmol/L (ref 22–32)
Calcium: 9.1 mg/dL (ref 8.9–10.3)
Chloride: 104 mmol/L (ref 98–111)
Creatinine, Ser: 0.92 mg/dL (ref 0.61–1.24)
GFR, Estimated: >60 mL/min (ref >60)
Glucose, Bld: 90 mg/dL (ref 70–99)
Potassium: 4 mmol/L (ref 3.5–5.1)
Sodium: 140 mmol/L (ref 135–145)

## 2020-11-11 LAB — HIV ANTIBODY (ROUTINE TESTING W REFLEX): HIV Screen 4th Generation wRfx: NONREACTIVE

## 2020-11-11 LAB — MAGNESIUM: Magnesium: 2 mg/dL (ref 1.7–2.4)

## 2020-11-11 MED ORDER — PREDNISONE 20 MG PO TABS: 40.0000 mg | ORAL_TABLET | Freq: Every day | ORAL | 0 refills | Status: AC

## 2020-11-11 MED ORDER — IPRATROPIUM-ALBUTEROL 0.5-2.5 (3) MG/3ML IN SOLN: 3.0000 mL | Freq: Four times a day (QID) | RESPIRATORY_TRACT | Status: DC | PRN

## 2020-11-11 MED ORDER — ALBUTEROL SULFATE HFA 108 (90 BASE) MCG/ACT IN AERS: 1.0000 | INHALATION_SPRAY | Freq: Four times a day (QID) | RESPIRATORY_TRACT | 0 refills | Status: AC | PRN

## 2020-11-11 MED ORDER — PREDNISONE 20 MG PO TABS
40.0000 mg | ORAL_TABLET | Freq: Every day | ORAL | Status: DC
  Administered 2020-11-11: 09:00:00 40 mg via ORAL
  Filled 2020-11-11: qty 2

## 2020-11-11 NOTE — Discharge Summary (Signed)
Discharge Summary  Louis Williamson:[id number] DOB: 26-Nov-2000  PCP: [facility]  Admit date: 11/09/2020 Discharge date: 11/11/2020  Time spent:  Recommendations for Outpatient Follow-up:  F/u with PCP within a week  for hospital discharge follow up    Discharge Diagnoses:  Active Hospital Problems   Diagnosis Date Noted   Acute asthma exacerbation 11/10/2020   Acute hypoxemic respiratory failure (HCC) 11/10/2020    Resolved Hospital Problems  No resolved problems to display.    Discharge Condition: stable  Diet recommendation: regular diet   Filed Weights   11/10/20 0025  Weight: 61.2 kg    History of present illness: ( per admitting MD Dr Arville Care) Louis Williamson Plan is a 20 y.o. Caucasian male with medical history significant for asthma, who presented to the emergency room with acute onset of worsening dry cough and wheezing with associated dyspnea for the last couple of days.  He denied any fever or chills.  No chest pain or palpitations.  No nausea or vomiting or abdominal pain.  Did not any rhinorrhea or nasal congestion or sore throat or earache.  No dysuria, oliguria or hematuria or flank pain.  ED Course: Upon position to the emergency room heart rate was 109 and pulse symmetry is 100% on CPAP and later on he was placed on BiPAP then tapered off.  Blood pressure later on dropped to 85/31 and responded to IV fluids coming up to 124/56.  EKG as reviewed by me :EKG showed sinus tachycardia with rate 111 with T wave inversion anteroseptally    Labs revealed an ABG with pH 7.61, HCO3 of 19.1 PCO2 of 19 and PO2 of 165 on BiPAP.  CBC showed no significant normalities.  CMP revealed hypokalemia of 2.7 and blood glucose was 168.  CK was 81 and high-sensitivity troponin I was 7 and later 9.  Influenza antigens and COVID-19 PCR came back negative.     Imaging: Portable chest ray showed no acute Pulmonary disease  The patient was given 6 nebulized Abrol and 1  DuoNeb as well as IV magnesium sulfate and IV Solu-Medrol by EMS.  In the ER he received 2 more duo nebs as well as 40 mill equivalent of IV potassium chloride and later was ordered 1 L bolus of IV normal saline.  He will be admitted to a progressive unit bed for further evaluation and management.  Hospital Course:  Active Problems:   Acute asthma exacerbation   Acute hypoxemic respiratory failure (HCC)  Acute hypoxic respiratory failure due to acute asthma exacerbation -much improved, now on room air, ambulating without difficulty, reports back to baseline, desires to go home -albuterol inhalor refill provided, discharged on prednisone 40mg  daily for three more days -f/u with pcp closely, patient and mother expressed understanding  Hypokalemia Replaced normalized   Procedures: Bipap initially  Consultations: none  Discharge Exam: BP 107/61 (BP Location: Right Arm)   Pulse 64   Temp 98.2 F (36.8 C) (Oral)   Resp 16   Ht 5\' 7"  (1.702 m)   Wt 61.2 kg   SpO2 99%   BMI 21.14 kg/m   General: NAD Cardiovascular: RRR Respiratory: CTABL  Discharge Instructions You were cared for by a hospitalist during your hospital stay. If you have any questions about your discharge medications or the care you received while you were in the hospital after you are discharged, you can call the unit and asked to speak with the hospitalist on call if the hospitalist that took  care of you is not available. Once you are discharged, your primary care physician will handle any further medical issues. Please note that NO REFILLS for any discharge medications will be authorized once you are discharged, as it is imperative that you return to your primary care physician (or establish a relationship with a primary care physician if you do not have one) for your aftercare needs so that they can reassess your need for medications and monitor your lab values.  Discharge Instructions     Diet general   Complete  by: As directed    Increase activity slowly   Complete by: As directed       Allergies as of 11/11/2020   No Known Allergies      Medication List     TAKE these medications    albuterol 1.25 MG/3ML nebulizer solution Commonly known as: ACCUNEB Take 3 mLs (1.25 mg total) by nebulization every 4 (four) hours as needed for wheezing or shortness of breath.   albuterol 108 (90 Base) MCG/ACT inhaler Commonly known as: VENTOLIN HFA Inhale 1-2 puffs into the lungs every 6 (six) hours as needed for wheezing or shortness of breath.   predniSONE 20 MG tablet Commonly known as: DELTASONE Take 2 tablets (40 mg total) by mouth daily with breakfast for 3 days. Start taking on: November 12, 2020   Symbicort 160-4.5 MCG/ACT inhaler Generic drug: budesonide-formoterol Inhale 2 puffs into the lungs in the morning and at bedtime.       No Known Allergies  Follow-up Information     [facility] Follow up in 1 week(s).   Specialty: Family Medicine Why: hospital discharge follow up Contact information: [address] [place] [address] [phone]                  The results of significant diagnostics from this hospitalization (including imaging, microbiology, ancillary and laboratory) are listed below for reference.    Significant Diagnostic Studies: DG Chest Port 1 View  Result Date: 11/10/2020 CLINICAL DATA:  Worsening shortness of breath. EXAM: PORTABLE CHEST 1 VIEW COMPARISON:  February 03, 2018 FINDINGS: The heart size and mediastinal contours are within normal limits. Both lungs are clear. The visualized skeletal structures are unremarkable. IMPRESSION: No active cardiopulmonary disease. Electronically Signed   By: Aram Candela M.D.   On: 11/10/2020 00:05    Microbiology: Recent Results (from the past 240 hour(s))  Resp Panel by RT-PCR (Flu A&B, Covid) Nasopharyngeal Swab     Status: None   Collection Time: 11/10/20 12:15 AM   Specimen:  Nasopharyngeal Swab; Nasopharyngeal(NP) swabs in vial transport medium  Result Value Ref Range Status   SARS Coronavirus 2 by RT PCR NEGATIVE NEGATIVE Final    Comment: (NOTE) SARS-CoV-2 target nucleic acids are NOT DETECTED.  The SARS-CoV-2 RNA is generally detectable in upper respiratory specimens during the acute phase of infection. The lowest concentration of SARS-CoV-2 viral copies this assay can detect is 138 copies/mL. A negative result does not preclude SARS-Cov-2 infection and should not be used as the sole basis for treatment or other patient management decisions. A negative result may occur with  improper specimen collection/handling, submission of specimen other than nasopharyngeal swab, presence of viral mutation(s) within the areas targeted by this assay, and inadequate number of viral copies(<138 copies/mL). A negative result must be combined with clinical observations, patient history, and epidemiological information. The expected result is Negative.  Fact Sheet for Patients:  BloggerCourse.com  Fact Sheet for Healthcare  Providers:  SeriousBroker.it  This test is no t yet approved or cleared by the [place] and  has been authorized for detection and/or diagnosis of SARS-CoV-2 by FDA under an Emergency Use Authorization (EUA). This EUA will remain  in effect (meaning this test can be used) for the duration of the COVID-19 declaration under Section 564(b)(1) of the Act, 21 U.S.C.section 360bbb-3(b)(1), unless the authorization is terminated  or revoked sooner.       Influenza A by PCR NEGATIVE NEGATIVE Final   Influenza B by PCR NEGATIVE NEGATIVE Final    Comment: (NOTE) The Xpert Xpress SARS-CoV-2/FLU/RSV plus assay is intended as an aid in the diagnosis of influenza from Nasopharyngeal swab specimens and should not be used as a sole basis for treatment. Nasal washings and aspirates are unacceptable for  Xpert Xpress SARS-CoV-2/FLU/RSV testing.  Fact Sheet for Patients: BloggerCourse.com  Fact Sheet for Healthcare Providers: SeriousBroker.it  This test is not yet approved or cleared by the [place] FDA and has been authorized for detection and/or diagnosis of SARS-CoV-2 by FDA under an Emergency Use Authorization (EUA). This EUA will remain in effect (meaning this test can be used) for the duration of the COVID-19 declaration under Section 564(b)(1) of the Act, 21 U.S.C. section 360bbb-3(b)(1), unless the authorization is terminated or revoked.  Performed at [facility], [address] [place], [place] [address]      Labs: Basic Metabolic Panel: Recent Labs  Lab 11/10/20 0013 11/10/20 0015 11/10/20 0832 11/11/20 0530  NA  --  138  --  140  K  --  2.7* 4.4 4.0  CL  --  104  --  104  CO2  --  20*  --  29  GLUCOSE  --  168*  --  90  BUN  --  14  --  14  CREATININE  --  0.94  --  0.92  CALCIUM  --  8.8*  --  9.1  MG 3.4*  --   --  2.0   Liver Function Tests: Recent Labs  Lab 11/10/20 0015  AST 23  ALT 14  ALKPHOS 58  BILITOT 0.9  PROT 7.4  ALBUMIN 4.6   No results for input(s): LIPASE, AMYLASE in the last 168 hours. No results for input(s): AMMONIA in the last 168 hours. CBC: Recent Labs  Lab 11/10/20 0015  WBC 9.1  NEUTROABS 5.1  HGB 15.5  HCT 41.4  MCV 86.3  PLT 258   Cardiac Enzymes: Recent Labs  Lab 11/10/20 0015  CKTOTAL 81   BNP: BNP (last 3 results) No results for input(s): BNP in the last 8760 hours.  ProBNP (last 3 results) No results for input(s): PROBNP in the last 8760 hours.  CBG: No results for input(s): GLUCAP in the last 168 hours.     Signed:  Albertine Grates MD, PhD, FACP  [facility] 11/11/2020, 9:53 AM

## 2020-11-11 NOTE — Progress Notes (Signed)
  Patient Saturations on Room Air at Rest = 99%  Patient Saturations on [employer] while Ambulating = 93%  Please briefly explain why patient needs home oxygen:  Patient able to ambulate 160 feet on room air and maintained O2 sats at or above 93% with no SOB, increased WOB or dizziness.

## 2021-06-28 ENCOUNTER — Ambulatory Visit
Admission: EM | Admit: 2021-06-28 | Discharge: 2021-06-28 | Disposition: A | Discharge status: Home / Self Care | Payer: BC Managed Care – PPO | Attending: Physician Assistant | Admitting: Physician Assistant

## 2021-06-28 DIAGNOSIS — Z113 Encounter for screening for infections with a predominantly sexual mode of transmission: Secondary | ICD-10-CM | POA: Insufficient documentation

## 2021-06-28 DIAGNOSIS — N489 Disorder of penis, unspecified: Secondary | ICD-10-CM | POA: Diagnosis not present

## 2021-06-28 DIAGNOSIS — N481 Balanitis: Secondary | ICD-10-CM | POA: Insufficient documentation

## 2021-06-28 LAB — HEPATITIS C ANTIBODY: HCV Ab: NONREACTIVE

## 2021-06-28 LAB — HIV ANTIBODY (ROUTINE TESTING W REFLEX): HIV Screen 4th Generation wRfx: NONREACTIVE

## 2021-06-28 MED ORDER — HYDROCORTISONE 1 % EX CREA: TOPICAL_CREAM | CUTANEOUS | 0 refills | Status: AC

## 2021-06-28 NOTE — Discharge Instructions (Signed)
This area appears to be irritation of the skin of your penis.  Apply hydrocortisone cream twice daily.  Use hypoallergenic soaps and detergents.  We will contact you with all of your lab work if anything is abnormal need to arrange treatment.  If you develop any additional symptoms please return for reevaluation.  ?

## 2021-06-28 NOTE — ED Provider Notes (Signed)
?[facility] ? ? ? ?CSN: [id number] ?Arrival date & time: 06/28/21  1501 ? ? ?  ? ?History   ?Chief Complaint ?Chief Complaint  ?Patient presents with  ? Rash  ?  Rash in groin area - Entered by patient  ? ? ?HPI ?Louis Williamson is a 21 y.o. male.  ? ?Patient presents today with a 1 week history of erythematous lesion on his penis.  He denies any pain, itching, irritation.  Denies any changes to personal hygiene products including soaps or detergents.  He denies episodes of similar symptoms in the past.  He is sexually active with male partners and has no concern for STI.  He is open to testing today.  Denies history of HSV or syphilis.  He denies any penile discharge, abdominal pain, dysuria, nausea, vomiting, fever. ? ? ?Past Medical History:  ?Diagnosis Date  ? Asthma   ? ? ?Patient Active Problem List  ? Diagnosis Date Noted  ? Acute asthma exacerbation 11/10/2020  ? Acute hypoxemic respiratory failure (HCC) 11/10/2020  ? Moderate persistent asthma without complication 11/07/2012  ? ? ?Past Surgical History:  ?Procedure Laterality Date  ? NO PAST SURGERIES    ? ? ? ? ? ?Home Medications   ? ?Prior to Admission medications   ?Medication Sig Start Date End Date Taking? Authorizing Provider  ?albuterol (PROVENTIL) (2.5 MG/3ML) 0.083% nebulizer solution Inhale into the lungs. 02/14/18  Yes [provider]  ?albuterol (VENTOLIN HFA) 108 (90 Base) MCG/ACT inhaler Inhale 1-2 puffs into the lungs every 6 (six) hours as needed for wheezing or shortness of breath. 11/11/20  Yes Albertine Grates, MD  ?budesonide-formoterol [facility]) 160-4.5 MCG/ACT inhaler Inhale 2 puffs into the lungs 2 (two) times daily.   Yes [provider]  ?cetirizine (ZYRTEC) 10 MG tablet Take 1 tablet by mouth daily. 10/25/18  Yes [provider]  ?fluticasone (FLONASE) 50 MCG/ACT nasal spray fluticasone propionate 50 mcg/actuation nasal spray,suspension   Yes [provider]  ?hydrocortisone cream 1 %  Apply to affected area 2 times daily 06/28/21  Yes Troyce Febo K, PA-C  ?ibuprofen (ADVIL) 600 MG tablet ibuprofen 600 mg tablet    [provider]  ?montelukast (SINGULAIR) 10 MG tablet Take 1 tablet by mouth daily. 11/17/20 11/17/21  [provider]  ? ? ?Family History ?Family History  ?Problem Relation Age of Onset  ? Healthy Mother   ? Healthy Father   ? ? ?Social History ?Social History  ? ?Tobacco Use  ? Smoking status: Never  ? Smokeless tobacco: Never  ?Vaping Use  ? Vaping Use: Former  ? Substances: Nicotine, Nicotine-salt  ?Substance Use Topics  ? Alcohol use: Not Currently  ?  Comment: Occ.  ? Drug use: No  ? ? ? ?Allergies   ?Patient has no known allergies. ? ? ?Review of Systems ?Review of Systems  ?Constitutional:  Negative for activity change, appetite change, fatigue and fever.  ?Gastrointestinal:  Negative for abdominal pain, diarrhea, nausea and vomiting.  ?Genitourinary:  Positive for genital sores. Negative for dysuria, flank pain, frequency, hematuria, penile discharge, penile pain and urgency.  ?Neurological:  Negative for dizziness, light-headedness and headaches.  ? ? ?Physical Exam ?Triage Vital Signs ?ED Triage Vitals  ?Enc Vitals Group  ?   BP 06/28/21 1610 118/78  ?   Pulse Rate 06/28/21 1610 80  ?   Resp 06/28/21 1610 18  ?   Temp 06/28/21 1610 98.3 ?F (36.8 ?C)  ?   Temp  Source 06/28/21 1610 Oral  ?   SpO2 06/28/21 1610 100 %  ?   Weight 06/28/21 1607 128 lb (58.1 kg)  ?   Height 06/28/21 1607 5\' 7"  (1.702 m)  ?   Head Circumference --   ?   Peak Flow --   ?   Pain Score 06/28/21 1607 0  ?   Pain Loc --   ?   Pain Edu? --   ?   Excl. in GC? --   ? ?No data found. ? ?Updated Vital Signs ?BP 118/78 (BP Location: Left Arm)   Pulse 80   Temp 98.3 ?F (36.8 ?C) (Oral)   Resp 18   Ht 5\' 7"  (1.702 m)   Wt 128 lb (58.1 kg)   SpO2 100%   BMI 20.05 kg/m?  ? ?Visual Acuity ?Right Eye Distance:   ?Left Eye Distance:   ?Bilateral Distance:   ? ?Right Eye Near:   ?Left Eye Near:     ?Bilateral Near:    ? ?Physical Exam ?Vitals reviewed. Exam conducted with a chaperone present.  ?Constitutional:   ?   General: He is awake.  ?   Appearance: Normal appearance. He is well-developed. He is not ill-appearing.  ?   Comments: Very pleasant male appears stated age in no acute distress  ?HENT:  ?   Head: Normocephalic and atraumatic.  ?   Mouth/Throat:  ?   Pharynx: No oropharyngeal exudate, posterior oropharyngeal erythema or uvula swelling.  ?Cardiovascular:  ?   Rate and Rhythm: Normal rate and regular rhythm.  ?   Heart sounds: Normal heart sounds, S1 normal and S2 normal. No murmur heard. ?Pulmonary:  ?   Effort: Pulmonary effort is normal.  ?   Breath sounds: Normal breath sounds. No stridor. No wheezing, rhonchi or rales.  ?   Comments: Clear to auscultation bilaterally ?Abdominal:  ?   General: Bowel sounds are normal.  ?   Palpations: Abdomen is soft.  ?   Tenderness: There is no abdominal tenderness.  ?   Comments: Benign abdominal exam  ?Genitourinary: ?   Penis: Circumcised. Lesions present.   ?   Comments: Erythematous lesion noted right lateral portion of glans.  No vesicles or ulcers noted.  No tenderness to palpation on exam. ? ?[place], RN present as chaperone during exam. ?Neurological:  ?   Mental Status: He is alert.  ?Psychiatric:     ?   Behavior: Behavior is cooperative.  ? ? ? ?UC Treatments / Results  ?Labs ?(all labs ordered are listed, but only abnormal results are displayed) ?Labs Reviewed  ?HSV CULTURE AND TYPING  ?RPR  ?HEPATITIS C ANTIBODY  ?HIV ANTIBODY (ROUTINE TESTING W REFLEX)  ?CYTOLOGY, (ORAL, ANAL, URETHRAL) ANCILLARY ONLY  ? ? ?EKG ? ? ?Radiology ?No results found. ? ?Procedures ?Procedures (including critical care time) ? ?Medications Ordered in UC ?Medications - No data to display ? ?Initial Impression / Assessment and Plan / UC Course  ?I have reviewed the triage vital signs and the nursing notes. ? ?Pertinent labs & imaging results that were available during  my care of the patient were reviewed by me and considered in my medical decision making (see chart for details). ? ?  ? ?Symptoms consistent with balanitis.  We will treat with hydrocortisone 1% twice daily.  Recommended hypoallergenic soaps and detergents.  We will obtain HSV testing in addition to complete STI panel.  Discussed that we will contact him if any treatment is required  based on results.  Discussed that if he develops any additional symptoms or if anything worsens he needs to return for reevaluation.  Strict return precautions given for which he expressed understanding. ? ?Final Clinical Impressions(s) / UC Diagnoses  ? ?Final diagnoses:  ?Balanitis  ?Penile lesion  ? ? ? ?Discharge Instructions   ? ?  ?This area appears to be irritation of the skin of your penis.  Apply hydrocortisone cream twice daily.  Use hypoallergenic soaps and detergents.  We will contact you with all of your lab work if anything is abnormal need to arrange treatment.  If you develop any additional symptoms please return for reevaluation.  ? ? ? ?ED Prescriptions   ? ? Medication Sig Dispense Auth. Provider  ? hydrocortisone cream 1 % Apply to affected area 2 times daily 15 g Tayveon Lombardo K, PA-C  ? ?  ? ?PDMP not reviewed this encounter. ?  ?Jeani Hawking, PA-C ?06/28/21 1647 ? ?

## 2021-06-28 NOTE — ED Triage Notes (Signed)
Patient is here for "Rash" or spot "on head on penis". First noticed "last week". No other rashes, bumps and/or concerns. No history of herpes, no exposure. No concern for STI.  ?

## 2021-06-29 LAB — CYTOLOGY, (ORAL, ANAL, URETHRAL) ANCILLARY ONLY
Chlamydia: NEGATIVE
Comment: NEGATIVE
Comment: NEGATIVE
Comment: NORMAL
Neisseria Gonorrhea: NEGATIVE
Trichomonas: NEGATIVE

## 2021-06-29 LAB — RPR: RPR Ser Ql: NONREACTIVE

## 2021-06-30 LAB — HSV CULTURE AND TYPING

## 2022-09-04 IMAGING — DX DG CHEST 1V PORT
1 series · 1 of 1 positions shown · non-contrast
Comparison: February 03, 2018

CLINICAL DATA: Worsening shortness of breath.

EXAM:
PORTABLE CHEST 1 VIEW

[chest ap]
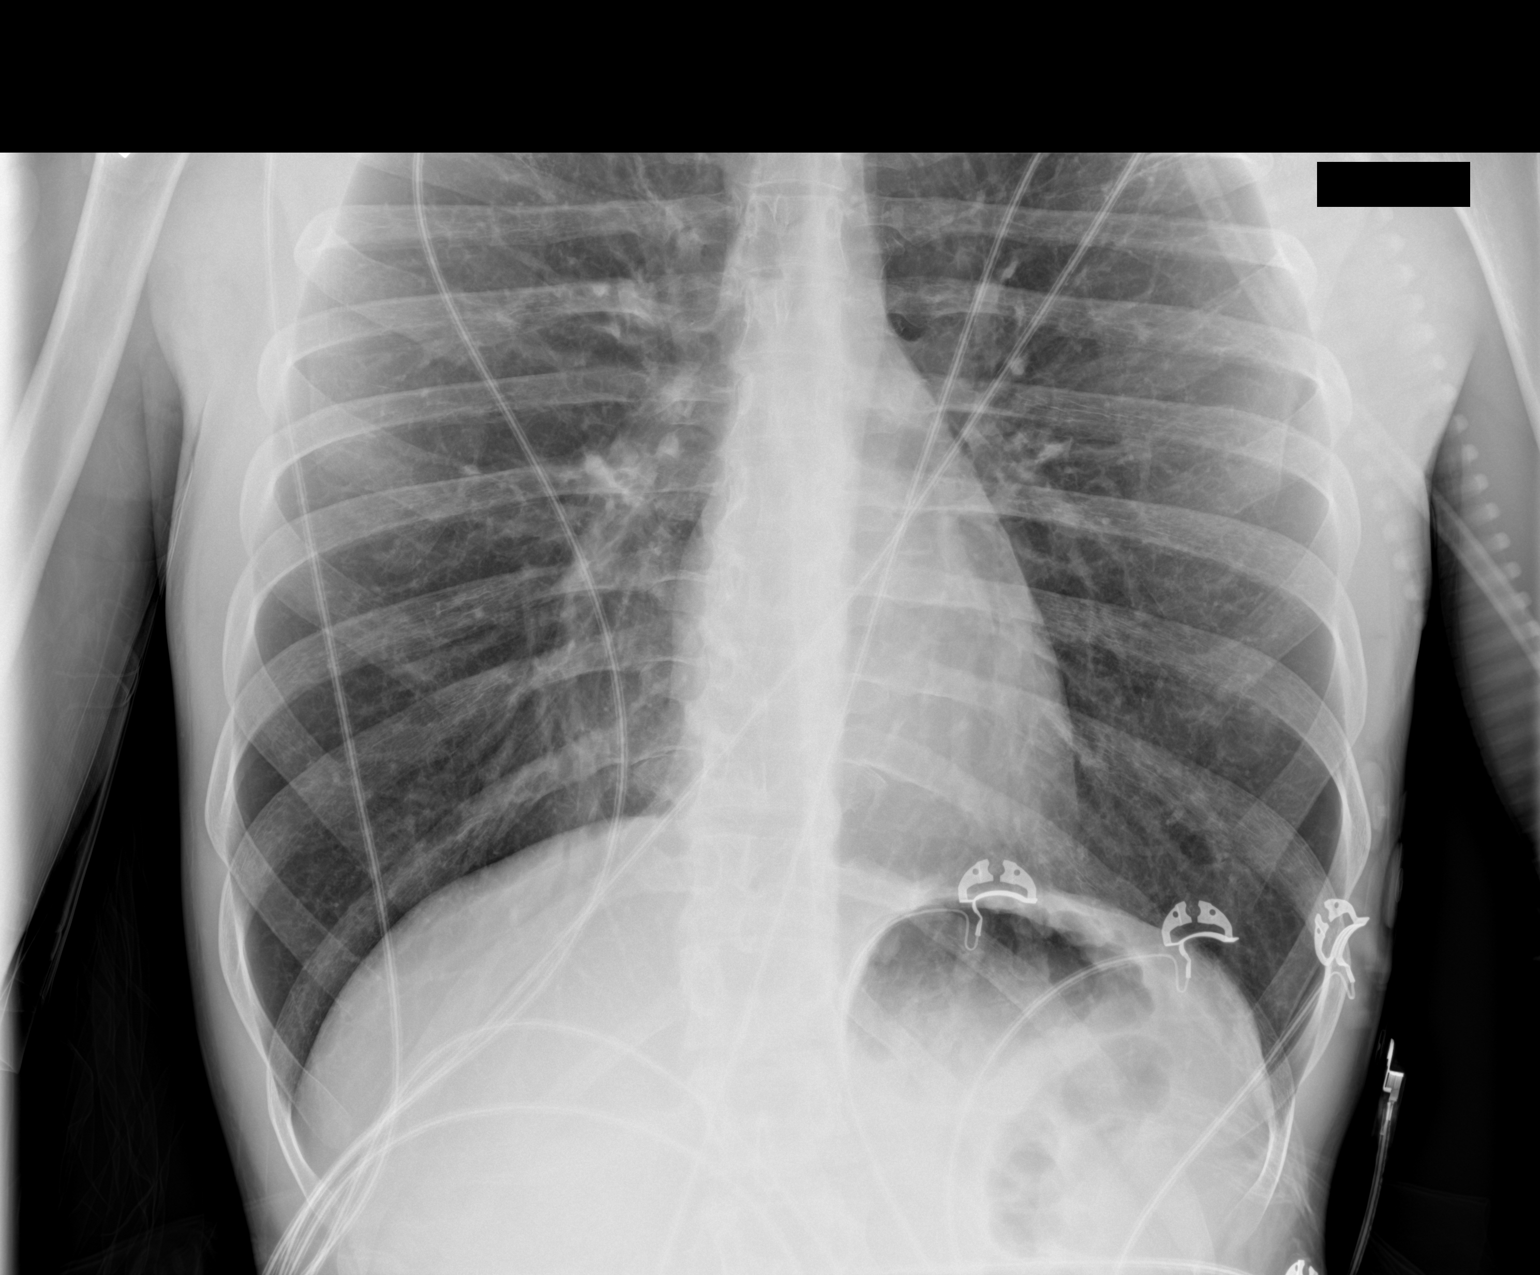

[1 of 1 positions shown; findings below may reference images not displayed]

FINDINGS: The heart size and mediastinal contours are within normal limits.
Both lungs are clear. The visualized skeletal structures are
unremarkable.
IMPRESSION: No active cardiopulmonary disease.
